# Patient Record
Sex: Female | Born: 2000
Health system: Southern US, Community
[De-identification: ages and names within clinical notes are randomized; demographics above are authoritative.]

## PROBLEM LIST (undated history)

## (undated) DIAGNOSIS — D649 Anemia, unspecified: Secondary | ICD-10-CM

## (undated) DIAGNOSIS — F419 Anxiety disorder, unspecified: Secondary | ICD-10-CM

## (undated) DIAGNOSIS — E611 Iron deficiency: Secondary | ICD-10-CM

## (undated) HISTORY — DX: Iron deficiency: E61.1

## (undated) HISTORY — DX: Anxiety disorder, unspecified: F41.9

## (undated) HISTORY — PX: WISDOM TOOTH EXTRACTION: SHX21

---
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
## 2019-01-14 ENCOUNTER — Ambulatory Visit (INDEPENDENT_AMBULATORY_CARE_PROVIDER_SITE_OTHER): Payer: Medicaid Other | Admitting: Family Medicine

## 2019-01-14 ENCOUNTER — Other Ambulatory Visit: Payer: Self-pay

## 2019-01-14 ENCOUNTER — Encounter: Payer: Self-pay | Admitting: Family Medicine

## 2019-01-14 VITALS — BP 118/78 | HR 92 | Temp 98.8°F | Ht 60.0 in | Wt 116.0 lb

## 2019-01-14 DIAGNOSIS — F320 Major depressive disorder, single episode, mild: Secondary | ICD-10-CM

## 2019-01-14 DIAGNOSIS — G44219 Episodic tension-type headache, not intractable: Secondary | ICD-10-CM | POA: Diagnosis not present

## 2019-01-14 DIAGNOSIS — F411 Generalized anxiety disorder: Secondary | ICD-10-CM | POA: Diagnosis not present

## 2019-01-14 DIAGNOSIS — Z23 Encounter for immunization: Secondary | ICD-10-CM | POA: Diagnosis not present

## 2019-01-14 DIAGNOSIS — Z7689 Persons encountering health services in other specified circumstances: Secondary | ICD-10-CM | POA: Diagnosis not present

## 2019-01-14 DIAGNOSIS — D509 Iron deficiency anemia, unspecified: Secondary | ICD-10-CM

## 2019-01-14 DIAGNOSIS — F32A Depression, unspecified: Secondary | ICD-10-CM

## 2019-01-14 NOTE — Patient Instructions (Signed)
Dear Vickie Bell,   It was nice to meet you today! I am glad you came in for your concerns. This document serves as a "wrap-up" to all that we discussed today and is listed as follows:    I will call you with your lab results and we will treat if necessary. In the mean time, you can come back if you feel like your headaches  Are getting worse, if you're feeling more anxious, or if you need anything else.    Thank you for choosing [facility] for your primary care needs and stay well!   Best,   Dr. Genia Hotter Resident Physician, PGY-1 [facility] [phone]    Don't forget to sign up for MyChart for instant access to your health profile, labs, orders, upcoming appointments or to contact your provider with questions. Stop at the front desk on the way out for more information about how to sign up!

## 2019-01-14 NOTE — Progress Notes (Signed)
SUBJECTIVE:  Subjective  PCP: Melene Plan, MD Patient ID: MRN [id number]  Date of birth: 01-Sep-2001  CC:  Chief Complaint  Patient presents with  . Establish Care   HPI Vickie Bell presents to establish care. Health complaints today include: anxiety and anemia.   Anxiety  Presents for initial visit. Onset was at an unknown time. The problem has been waxing and waning. Symptoms include depressed mood, excessive worry, hyperventilation, nervous/anxious behavior, palpitations and restlessness. Patient reports no chest pain, dizziness, obsessions or panic. Symptoms occur occasionally. The severity of symptoms is mild. The symptoms are aggravated by family issues. The quality of sleep is good. Nighttime awakenings: none.  Patient reports generalized anxiety. Patient reports that she often finds herself asking a lot of what if questions.  Her past medical history is significant for anemia. Past treatments include nothing.  GAD 7 : Generalized Anxiety Score 01/17/2019  Nervous, Anxious, on Edge 3  Control/stop worrying 3  Worry too much - different things 3  Trouble relaxing 0  Restless 0  Easily annoyed or irritable 0  Afraid - awful might happen 3  Total GAD 7 Score 12  Anxiety Difficulty Somewhat difficult     Office Visit from 01/14/2019 in [facility]  PHQ-9 Total Score  6     Anemia  Previously known to be anemic. Denies dizziness, feeling tired.  Does report fast heart beating.   Headaches  All last week, occurred twice. Headband distribution. Relieved with ibuprofen. Occurs midday, pain 7-8/10. No n/v. No photophobia.  Goes to the eye doctor q6 months. August, saw Dr. Karleen Hampshire (neurologist).   Review of Symptoms:  Review of Systems  Constitutional: Negative.  Negative for weight loss.  HENT: Negative.   Eyes: Negative.   Respiratory: Negative.   Cardiovascular: Positive for palpitations.  Gastrointestinal: Negative.   Genitourinary:  Negative.   Musculoskeletal: Positive for myalgias.  Skin: Negative.  Negative for rash.  Neurological: Positive for headaches. Negative for dizziness.  Endo/Heme/Allergies: Does not bruise/bleed easily.  Psychiatric/Behavioral: The patient is nervous/anxious.    Medications/Allergies:  No allergies  No medications   Past Medical History:  Past Medical History:  Diagnosis Date  . Anemia   . Anxiety   . Low iron    Previous Doctor:  Previously seen at [facility]   Surgeries:  Past Surgical History:  Procedure Laterality Date  . WISDOM TOOTH EXTRACTION     Hospitalizations:  None   Psychiatric History:  Hx of generalized anxiety. Never on medications, never hospitalized   Family History:  She indicated that her mother is alive. She indicated that her father is alive. She indicated that her sister is alive. She indicated that both of her brothers are alive. She indicated that her maternal grandmother is alive. She indicated that her maternal grandfather is alive. She indicated that her paternal grandmother is alive. She indicated that her paternal grandfather is alive. Negative for heart disease or sudden cardiac death.   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 Currently having trouble with math.    Tobacco/Vaping: denies   Alcohol: denies   Drugs: denies   Illicit Drugs:denies       Sexual history: never sexually active.    Partner preference: males. Has been in romantic relationships previously,  but not now.          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   She lives with her mom and her twin brother.    She has an older brother and older sister.   She enjoys dancing and playing basketball       OBJECTIVE:  Objective   BP 118/78   Pulse 92   Temp 98.8  F (37.1 C) (Oral)   Ht 5' (1.524 m)   Wt 116 lb (52.6 kg)   LMP 01/07/2019 (Approximate)   HC 3" (7.6 cm)   SpO2 99%   BMI 22.65 kg/m   Physical Exam Gen: NAD, alert, non-toxic, well-nourished, well-appearing, pleasant HEENT: Normocephaic, atraumatic. PERRLA, clear conjuctiva, no scleral icterus and injection. Normal EOM.  Hearing intact. TM pearly grey bilaterally with no fluid.  Neck supple with no LAD, nodules, or gross abnormality.  Nares patent with no discharge.  Maxillary and frontal sinuses nontender to palpation.  Oropharynx without erythema and lesions.  Tonsils nonswollen and without exudate.   CV: Regular rate and rhythm.  Normal S1-S2.  No murmur, gallops, S3, S4 appreciated.  Normal capillary refill bilaterally.  Radial pulses 2+ bilaterally. No bilateral lower extremity edema. Resp: Clear to auscultation bilaterally.  No wheezing, rales, rhonchi, or other abnormal lung sounds.  No increased work of breathing appreciated. Abd: Nontender and nondistended on palpation to all 4 quadrants.  Positive bowel sounds. Skin: No obvious rashes, lesions, or trauma.  Normal turgor.  MSK: Normal ROM. Normal strength and tone.  Neuro: Cranial nerves II through VI grossly intact. Gait normal.  Alert and oriented x4.  No obvious abnormal movements. Psych: Cooperative with exam.  Normal speech. Pleasant. Makes good eye contact. Genitourinary: deferred.   Pertinent Labs & Imaging:  Medications Reviewed in Chart Allergies Reviewed in Chart Past Medical History Reviewed in Chart.  Family History Reviewed in Chart.  Social History Reviewed in Chart  Pertinent Labs Reviewed in Chart Pertinent Imaging Reviewed Updates and changes made      ASSESSMENT & PLAN  Anemia, iron deficiency Hx of iron deficiency anemia  Need to get CBC for baseline Hgb + anemia panel   Anxiety state Mild. Pt with good coping skills. Continue to monitor. No medications.   Mild depression (HCC) Mild. Pt with good  coping skills. Continue to monitor. No medications.   Episodic tension-type headache, not intractable Possibly due to stress. No other neurological deficit. Ophthalmologist appointment q 6 months. No red flags.  Continue to monitor.     Health Maintenance Due  Topic Date Due  . HIV Screening  10/13/2015    Genia Hotter, M.D. [facility] 01/18/2019, 12:00 AM

## 2019-01-17 ENCOUNTER — Encounter: Payer: Self-pay | Admitting: Family Medicine

## 2019-01-17 DIAGNOSIS — F32A Depression, unspecified: Secondary | ICD-10-CM | POA: Insufficient documentation

## 2019-01-17 DIAGNOSIS — D509 Iron deficiency anemia, unspecified: Secondary | ICD-10-CM | POA: Insufficient documentation

## 2019-01-17 DIAGNOSIS — F419 Anxiety disorder, unspecified: Secondary | ICD-10-CM | POA: Insufficient documentation

## 2019-01-17 DIAGNOSIS — F320 Major depressive disorder, single episode, mild: Secondary | ICD-10-CM

## 2019-01-17 DIAGNOSIS — F411 Generalized anxiety disorder: Secondary | ICD-10-CM | POA: Insufficient documentation

## 2019-01-17 DIAGNOSIS — Z23 Encounter for immunization: Secondary | ICD-10-CM | POA: Insufficient documentation

## 2019-01-17 HISTORY — DX: Depression, unspecified: F32.A

## 2019-01-17 HISTORY — DX: Major depressive disorder, single episode, mild: F32.0

## 2019-01-17 LAB — ANEMIA PANEL
FERRITIN: 2 ng/mL — AB (ref 15–77)
FOLATE, HEMOLYSATE: 285 ng/mL
FOLATE, RBC: 1239 ng/mL (ref >498)
HEMATOCRIT: 23 % — AB (ref 34.0–46.6)
IRON SATURATION: 3 % — AB (ref 15–55)
Iron: 13 ug/dL — ABNORMAL LOW (ref 27–159)
Retic Ct Pct: 0.9 % (ref 0.6–2.6)
Total Iron Binding Capacity: 472 ug/dL — ABNORMAL HIGH (ref 250–450)
UIBC: 459 ug/dL — ABNORMAL HIGH (ref 131–425)
VITAMIN B 12: 448 pg/mL (ref 232–1245)

## 2019-01-17 LAB — BASIC METABOLIC PANEL
BUN/Creatinine Ratio: 26 — ABNORMAL HIGH (ref 9–23)
BUN: 15 mg/dL (ref 6–20)
CO2: 23 mmol/L (ref 20–29)
CREATININE: 0.58 mg/dL (ref 0.57–1.00)
Calcium: 9.5 mg/dL (ref 8.7–10.2)
Chloride: 106 mmol/L (ref 96–106)
GFR, EST AFRICAN AMERICAN: 156 mL/min/{1.73_m2} (ref >59)
GFR, EST NON AFRICAN AMERICAN: 135 mL/min/{1.73_m2} (ref >59)
Glucose: 104 mg/dL — ABNORMAL HIGH (ref 65–99)
POTASSIUM: 4.1 mmol/L (ref 3.5–5.2)
SODIUM: 141 mmol/L (ref 134–144)

## 2019-01-17 LAB — TSH: TSH: 0.718 u[IU]/mL (ref 0.450–4.500)

## 2019-01-17 NOTE — Assessment & Plan Note (Signed)
Mild. Pt with good coping skills. Continue to monitor. No medications.  

## 2019-01-17 NOTE — Assessment & Plan Note (Signed)
Mild. Pt with good coping skills. Continue to monitor. No medications.

## 2019-01-17 NOTE — Progress Notes (Deleted)
   New Patient Office Visit  Subjective:  Patient ID: Vickie Bell, female    DOB: 10-23-2001  Age: 18 y.o. MRN: [id number]  CC:  Chief Complaint  Patient presents with  . Establish Care   HPI Vickie Bell presents to establish care. Health complaints today include: anxiety and anemia.   #Anxiety  Patient reports generalized anxiety  Patient reports that she often finds herself asking a lot of what if questions.   Past Medical History:  Diagnosis Date  . Anemia   . Low iron     Past Surgical History:  Procedure Laterality Date  . WISDOM TOOTH EXTRACTION      Family History  Problem Relation Age of Onset  . Mood Disorder Mother   . Hypertension Mother   . Asthma Mother   . 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   She lives with her mom and her twin brother.    She has an older brother and older sister.   She enjoys dancing and playing basketball     ROS Review of Systems  Objective:   Today's Vitals: BP 118/78   Pulse 92   Temp 98.8 F (37.1 C) (Oral)   Ht 5' (1.524 m)   Wt 116 lb (52.6 kg)   LMP 01/07/2019 (Approximate)   HC 3" (7.6 cm)   SpO2 99%   BMI 22.65 kg/m   Physical Exam  Assessment & Plan:   Problem List Items Addressed This Visit    None    Visit Diagnoses    Other iron deficiency anemia    -  Primary   Relevant Orders   Anemia panel (Completed)   TSH (Completed)   Basic Metabolic Panel (Completed)   CBC   Anxiety state       Need for immunization against influenza       Relevant Orders   Flu Vaccine QUAD 36+ mos IM (Completed)      Follow-up: No follow-ups on file.   Melene Plan, MD

## 2019-01-17 NOTE — Assessment & Plan Note (Addendum)
Hx of iron deficiency anemia  Need to get CBC for baseline Hgb + anemia panel

## 2019-01-17 NOTE — Assessment & Plan Note (Signed)
Possibly due to stress. No other neurological deficit. Ophthalmologist appointment q 6 months. No red flags.  Continue to monitor.

## 2019-01-18 ENCOUNTER — Encounter: Payer: Self-pay | Admitting: Family Medicine

## 2019-01-18 ENCOUNTER — Telehealth: Payer: Self-pay | Admitting: *Deleted

## 2019-01-18 NOTE — Telephone Encounter (Signed)
Contacted pt mom and appointment scheduled for 01/28/19. Lamonte Sakai, April D, [place]

## 2019-01-18 NOTE — Telephone Encounter (Signed)
-----   Message from Melene Plan, MD sent at 01/18/2019 12:11 AM EST ----- Did not obtain CBC for baseline Hgb, this was my mistake. Can you please ask this patient to come in at her earliest convenience to do another blood draw? Please let patient know that her TSH was normal, but her iron is low. I will wait until I get her CBC back in order to get a better understand for the cause of her anemia.

## 2019-01-21 ENCOUNTER — Other Ambulatory Visit: Payer: Medicaid Other

## 2019-01-21 DIAGNOSIS — D509 Iron deficiency anemia, unspecified: Secondary | ICD-10-CM

## 2019-01-22 LAB — CBC
HEMOGLOBIN: 6.6 g/dL — AB (ref 11.1–15.9)
Hematocrit: 23.1 % — ABNORMAL LOW (ref 34.0–46.6)
MCH: 19.2 pg — ABNORMAL LOW (ref 26.6–33.0)
MCHC: 28.6 g/dL — ABNORMAL LOW (ref 31.5–35.7)
MCV: 67 fL — ABNORMAL LOW (ref 79–97)
Platelets: 271 10*3/uL (ref 150–450)
RBC: 3.43 x10E6/uL — ABNORMAL LOW (ref 3.77–5.28)
RDW: 16.3 % — ABNORMAL HIGH (ref 11.7–15.4)
WBC: 4.2 10*3/uL (ref 3.4–10.8)

## 2019-01-22 NOTE — Addendum Note (Signed)
Addended by: Melene Plan on: 01/22/2019 02:35 PM   Modules accepted: Orders

## 2019-01-24 MED ORDER — FERROUS SULFATE 325 (65 FE) MG PO TBEC: 650.0000 mg | DELAYED_RELEASE_TABLET | ORAL | 3 refills | Status: DC

## 2019-01-24 NOTE — Progress Notes (Signed)
Patient with history of iron deficiency anemia, however no records from [facility] available at initial visit.  Patient's labs are most consistent with iron deficiency anemia.  Hemoglobin of 6.6 obtained on 2/21.  Attempted to reach family but left voice message. We will plan to start iron supplements every other day, twice daily between meals.  Plans to titrate with any intolerance.  Plans to recheck CBC in 3 weeks for response to iron pills.

## 2019-01-24 NOTE — Addendum Note (Signed)
Addended by: Melene Plan on: 01/24/2019 09:58 AM   Modules accepted: Orders

## 2019-01-25 ENCOUNTER — Telehealth: Payer: Self-pay

## 2019-01-25 NOTE — Telephone Encounter (Signed)
Patient with history of iron deficiency anemia, however no records from [facility] available at initial visit.  Patient's labs are most consistent with iron deficiency anemia.  Hemoglobin of 6.6 obtained on 2/21.  Attempted to reach family but left voice message. We will plan to start iron supplements every other day, twice daily between meals.  Plans to titrate with any intolerance.  Plans to recheck CBC in 3 weeks for response to iron pills.   Pts mom called nurse line returning pcp phone call. I relayed the lab results and plan to mom. I informed mom an iron supplement has been called into their pharmacy, directions of use given. Informed mom to return to clinic for a lab visit to recheck CBC after 3 weeks of taking the iron supplement. Mom verbalized understanding.

## 2019-01-28 ENCOUNTER — Other Ambulatory Visit: Payer: Medicaid Other

## 2019-09-15 IMAGING — DX DG SHOULDER 2+V*L*
4 series · 4 of 4 positions shown · non-contrast
Comparison: None.

CLINICAL DATA: Pain following fall

EXAM:
LEFT SHOULDER - 2+ VIEW

[shoulder ap]
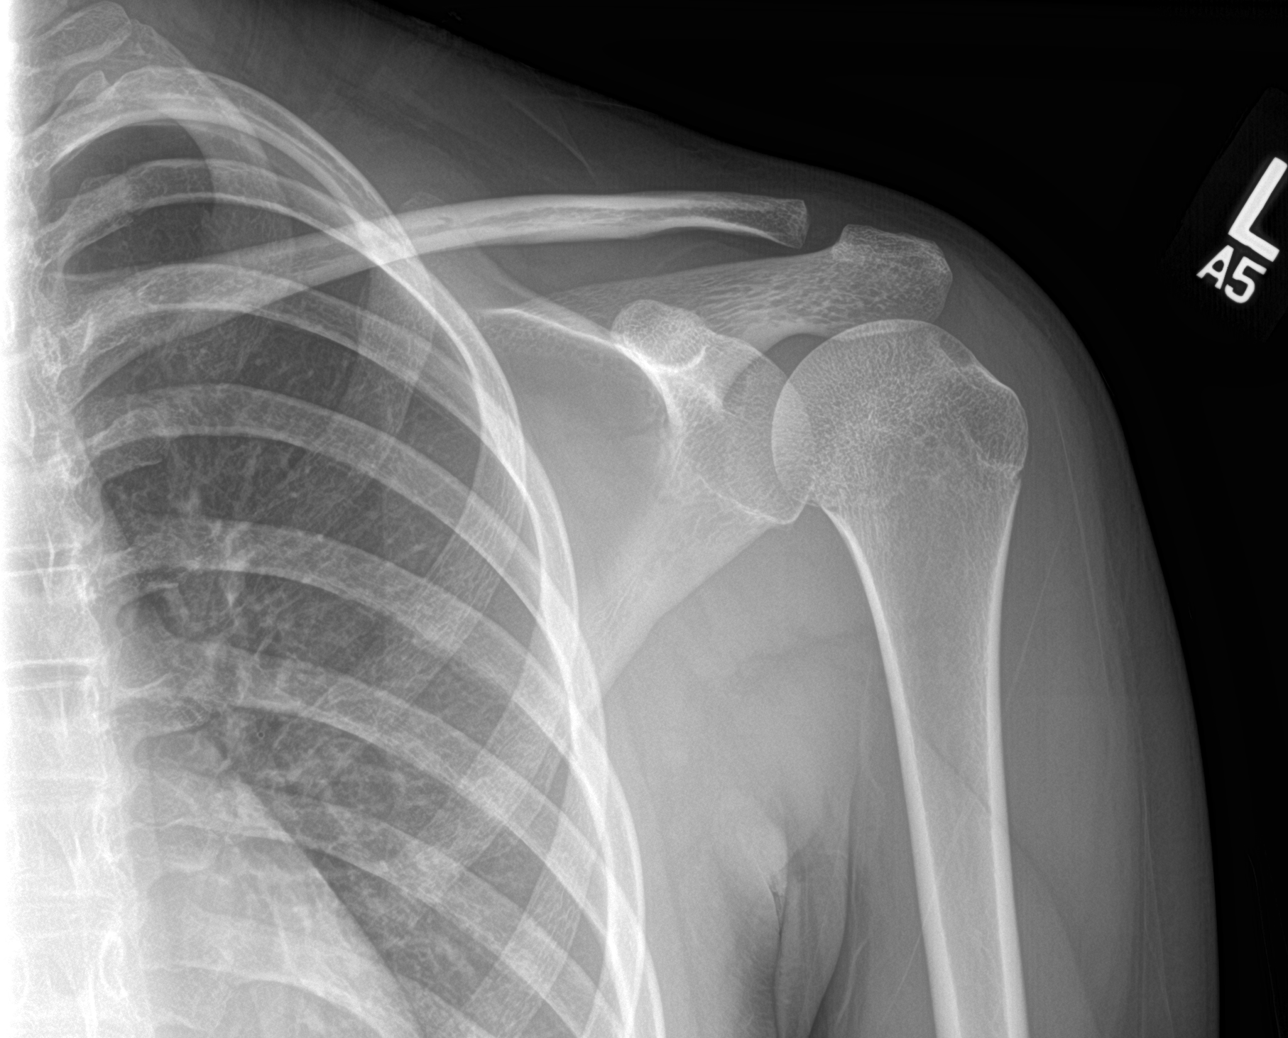

[shoulder grashey]
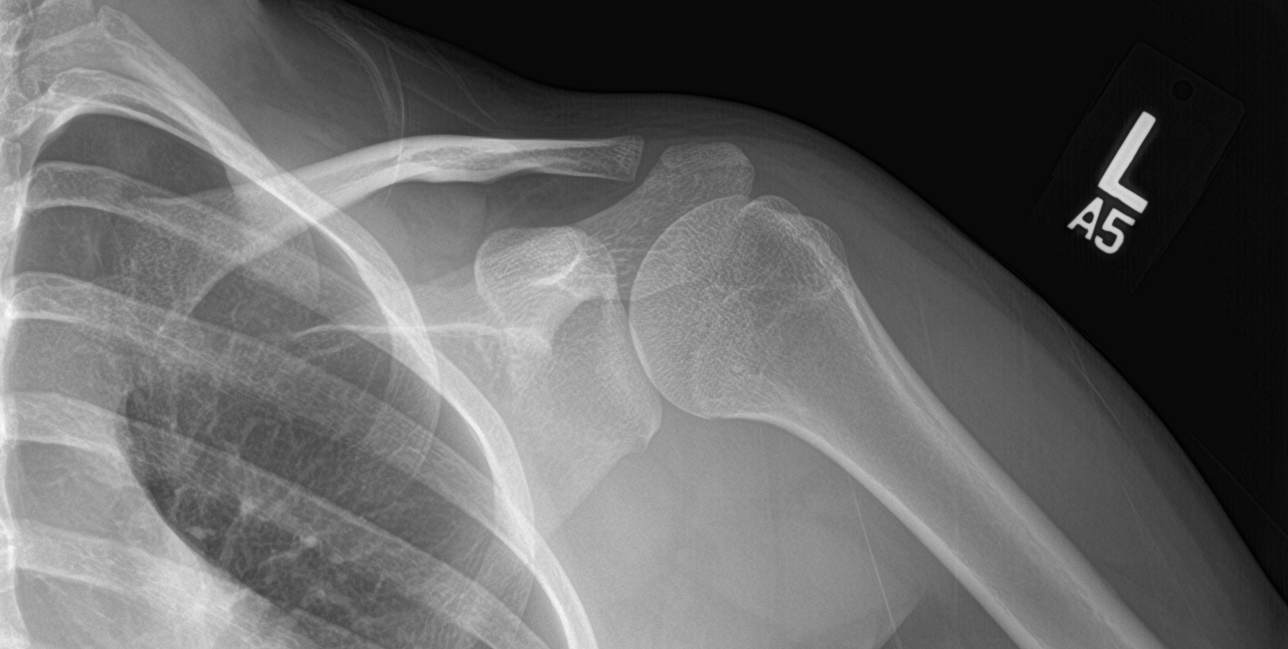

[shoulder y-view]
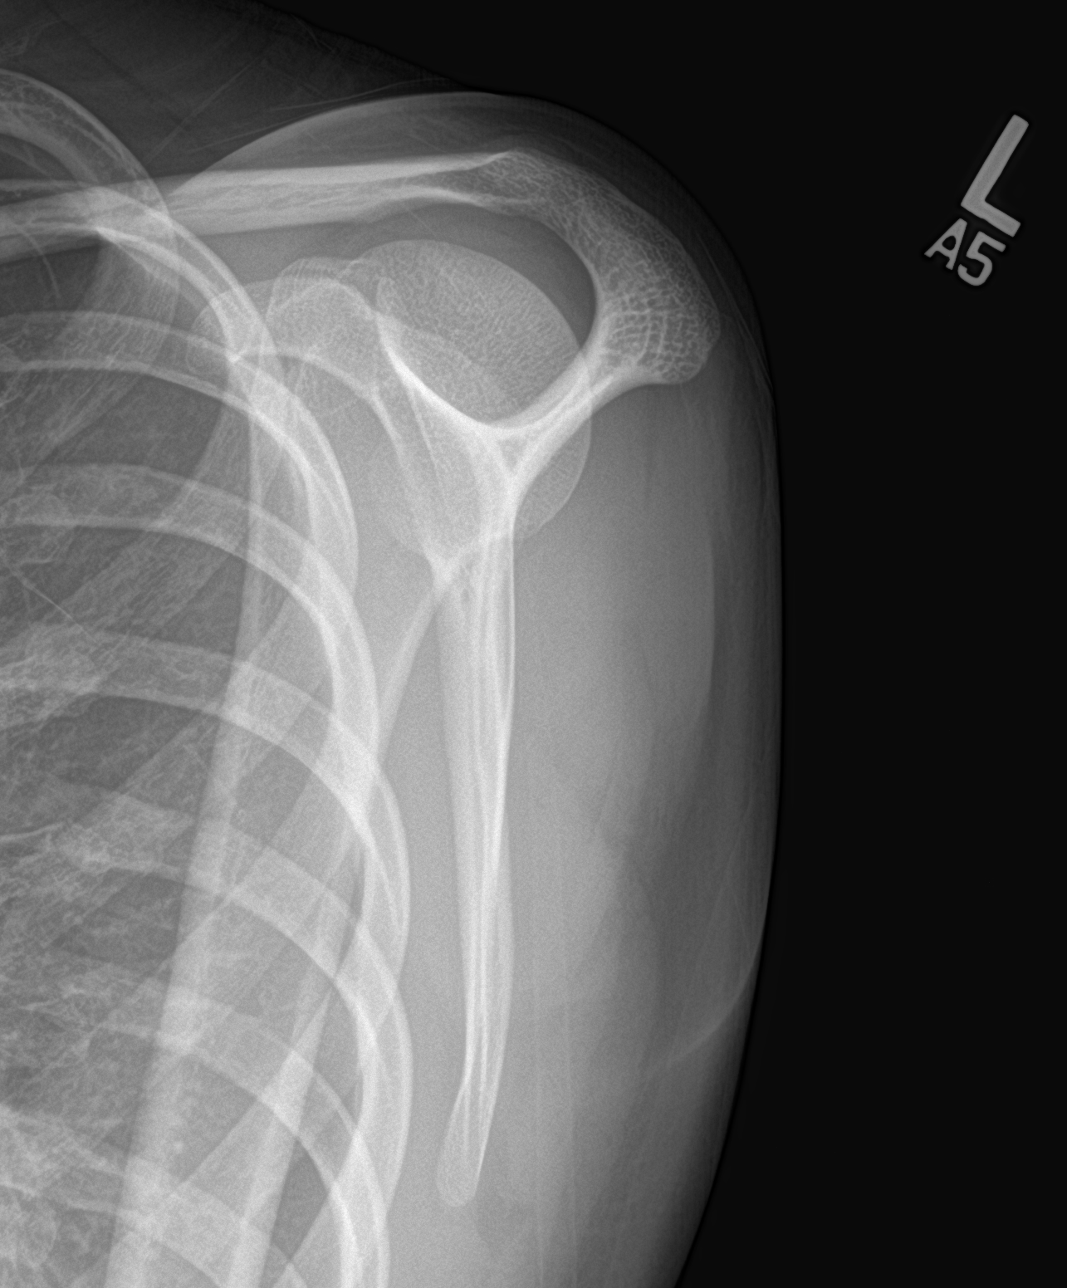

[shoulder axial]
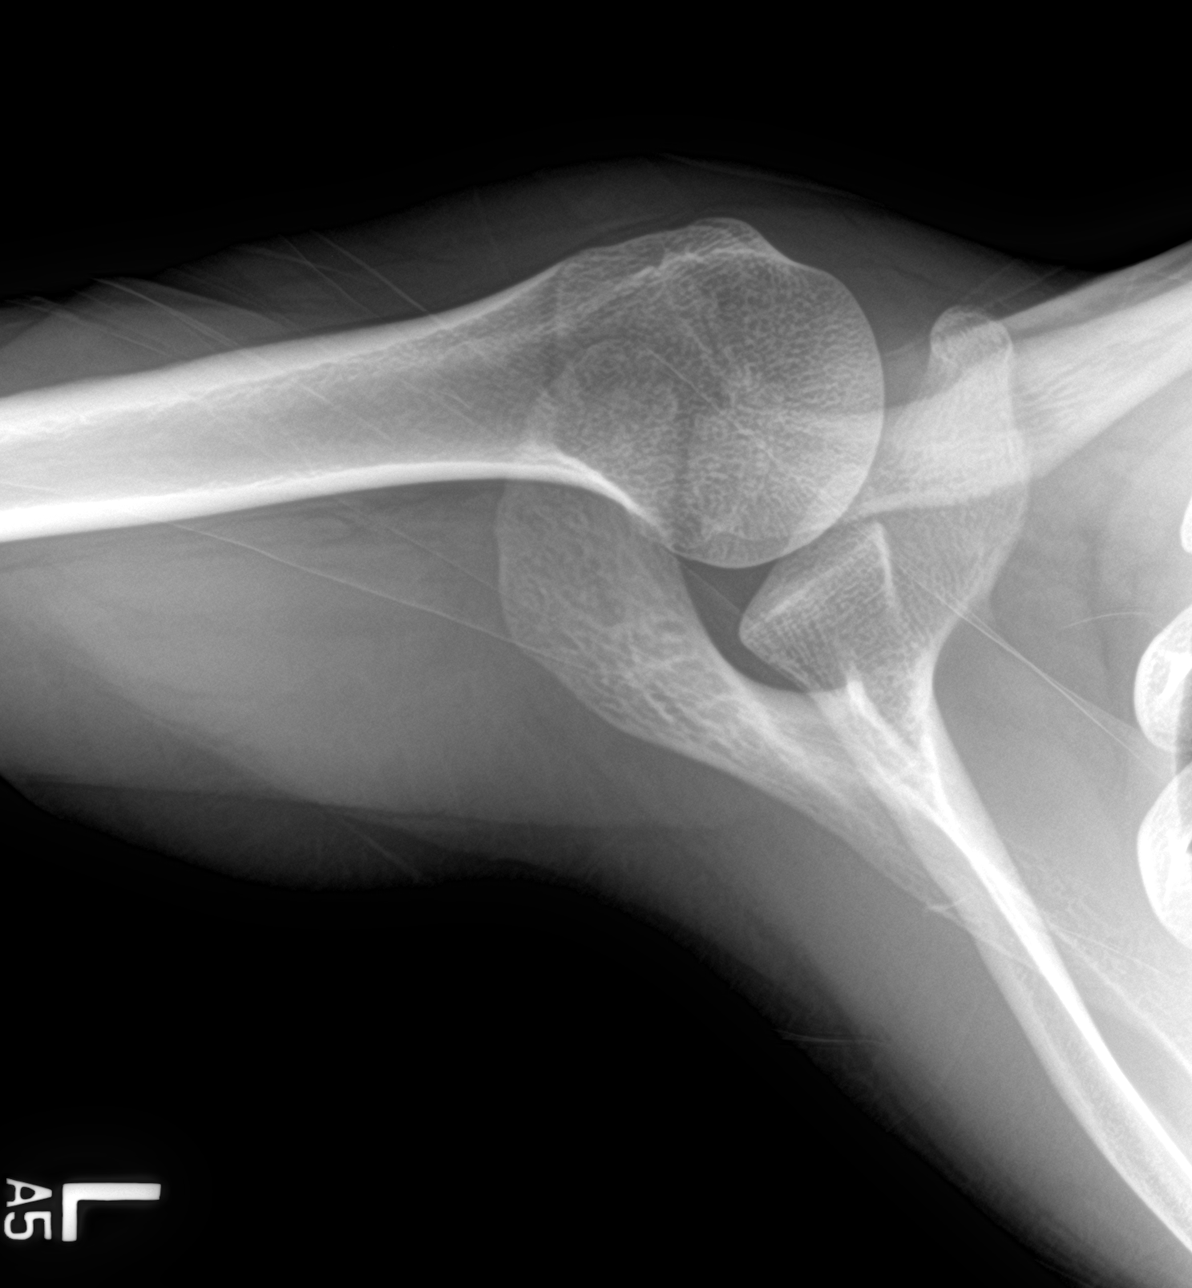

[4 of 4 positions shown; findings below may reference images not displayed]

FINDINGS: Internal rotation, external rotation, Y scapular, axillary images
were obtained. There is no evident fracture or dislocation. The
joint spaces appear normal. No erosive change. Visualized left lung
clear.
IMPRESSION: No fracture or dislocation.  No evident arthropathy.

## 2020-01-06 ENCOUNTER — Other Ambulatory Visit: Payer: Self-pay

## 2020-01-06 ENCOUNTER — Ambulatory Visit (INDEPENDENT_AMBULATORY_CARE_PROVIDER_SITE_OTHER): Payer: Medicaid Other

## 2020-01-06 DIAGNOSIS — Z111 Encounter for screening for respiratory tuberculosis: Secondary | ICD-10-CM | POA: Diagnosis not present

## 2020-01-06 NOTE — Progress Notes (Signed)
Patient is here for a PPD test. Pt has no known allergies and has never had positive PPD test.   Placed in left forearm at 1500. Site unremarkable. Wheel noted after placement.   Pt scheduled nurse visit for Monday 01/09/20 at 1500.  Veronda Prude, RN

## 2020-01-09 ENCOUNTER — Ambulatory Visit (INDEPENDENT_AMBULATORY_CARE_PROVIDER_SITE_OTHER): Payer: Medicaid Other | Admitting: *Deleted

## 2020-01-09 ENCOUNTER — Other Ambulatory Visit: Payer: Self-pay

## 2020-01-09 DIAGNOSIS — Z111 Encounter for screening for respiratory tuberculosis: Secondary | ICD-10-CM

## 2020-01-09 LAB — TB SKIN TEST
Induration: 0 mm
TB Skin Test: NEGATIVE

## 2020-01-09 NOTE — Progress Notes (Signed)
Patient is here for a PPD read.  It was placed on 01/06/20 in the left forearm @ 3pm.    PPD RESULTS:  Result: Negative Induration: 0 mm  Letter created and given to patient for documentation purposes. Jone Baseman, CMA

## 2020-01-17 ENCOUNTER — Ambulatory Visit (INDEPENDENT_AMBULATORY_CARE_PROVIDER_SITE_OTHER): Payer: Medicaid Other | Admitting: Family Medicine

## 2020-01-17 ENCOUNTER — Other Ambulatory Visit: Payer: Self-pay

## 2020-01-17 ENCOUNTER — Encounter: Payer: Self-pay | Admitting: Family Medicine

## 2020-01-17 VITALS — BP 110/76 | HR 74 | Wt 122.8 lb

## 2020-01-17 DIAGNOSIS — F419 Anxiety disorder, unspecified: Secondary | ICD-10-CM

## 2020-01-17 DIAGNOSIS — F320 Major depressive disorder, single episode, mild: Secondary | ICD-10-CM

## 2020-01-17 DIAGNOSIS — F32A Depression, unspecified: Secondary | ICD-10-CM

## 2020-01-17 MED ORDER — FLUOXETINE HCL 10 MG PO CAPS: 10.0000 mg | ORAL_CAPSULE | Freq: Every day | ORAL | 0 refills | Status: DC

## 2020-01-17 MED FILL — FLUoxetine HCL 10 MG CAPS: 10 | 30 days supply | Qty: 30 | Fill #0

## 2020-01-17 NOTE — Assessment & Plan Note (Addendum)
Patient reports increased anxiety since starting her new job and moving in with her grandmother.  She reports that she listens to music to decrease her anxiety which helps some but says that she is not exercising regularly and feels like that may help as well.  Patient is also interested in talking with a therapist regarding her issues with anxiety and depression.  Patient reports heart palpitations as well as muscle twitches in her right arm.  I believe these are most likely related to the anxiety. -Follow-up with behavioral health -Started patient on fluoxetine 10 mg daily -Encouraged exercise, listening to music, and other coping mechanisms to decrease anxiety -Follow-up in 2-4 weeks -Discussed return precautions if muscle spasms get worse or patient begins to feel worse.  Patient voices understanding

## 2020-01-17 NOTE — Assessment & Plan Note (Signed)
Patient reports mild depression.  PHQ 2 score today was 5, PHQ-9 score was 11.  Patient reports she feels this is most likely related to the fact that she does not get to spend time with friends like she did before COVID.  She feels like there is not much to do outside of work.  She enjoys listening to music to help with her depression anxiety. -Follow-up in 2 to 4 weeks, repeat PHQ-9 -Started on Prozac 10 mg daily -Recommend seeing behavioral health

## 2020-01-17 NOTE — Progress Notes (Signed)
   CHIEF COMPLAINT / HPI:  Right arm twitching Patient reports that she has had twitching in her right arm near her elbow for the last 2 weeks.  She denies any injury to that arm.  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  She reports a large amount of increased stress at this time.  She has also noticed heart palpitations and feels she has difficulty catching her breath.  She reports sleeping on it may make the arm worse but feels like the twitching overall has gotten better over the last 2 weeks but is still present.  Patient denies any weakness in that arm.  Depression/anxiety: Patient reports increased anxiety since starting her new job 2 weeks ago.  She reports that she enjoys the work but that her teacher stresses her out.  To deal with the stress she either does not listen to her teacher or she listens to music including [place].  She reports that she feels like exercise may help with anxiety and depression and reports that she will start doing that.  She denies any SI or HI.  PERTINENT  PMH / PSH: Anxiety and mild depression   OBJECTIVE: BP 110/76   Pulse 74   Wt 122 lb 12.8 oz (55.7 kg)   LMP 12/30/2019   SpO2 99%   BMI 23.98 kg/m   General: NAD, resting comfortably on exam table HEENT: Atraumatic. Normocephalic.  Neck: No cervical lymphadenopathy.  Cardiac: RRR, no m/r/g Respiratory: CTAB, normal work of breathing Neuro: alert and oriented, patient has muscle spasms in right arm.  They resolve or become irregular when patient is distracted Psych: Patient reports increased levels of anxiety and mild depression although she reports she is doing "okay with depression".  Patient recently started new job which is why she feels anxious as well as moved in with her grandmother because her mother moved.  Patient denies current or past HI/SI.  ASSESSMENT / PLAN:  Anxiety Patient reports increased anxiety since starting her  new job and moving in with her grandmother.  She reports that she listens to music to decrease her anxiety which helps some but says that she is not exercising regularly and feels like that may help as well.  Patient is also interested in talking with a therapist regarding her issues with anxiety and depression.  Patient reports heart palpitations as well as muscle twitches in her right arm.  I believe these are most likely related to the anxiety. -Follow-up with behavioral health -Started patient on fluoxetine 10 mg daily -Encouraged exercise, listening to music, and other coping mechanisms to decrease anxiety -Follow-up in 2-4 weeks -Discussed return precautions if muscle spasms get worse or patient begins to feel worse.  Patient voices understanding    Mild depression (HCC) Patient reports mild depression.  PHQ 2 score today was 5, PHQ-9 score was 11.  Patient reports she feels this is most likely related to the fact that she does not get to spend time with friends like she did before COVID.  She feels like there is not much to do outside of work.  She enjoys listening to music to help with her depression anxiety. -Follow-up in 2 to 4 weeks, repeat PHQ-9 -Started on Prozac 10 mg daily -Recommend seeing behavioral health     Derrel Nip, MD [facility]

## 2020-01-17 NOTE — Patient Instructions (Signed)
It was a pleasure to meet you today.  I am sorry you are having issues with this twitching in your arm as well as issues with anxiety.  I am going to do is prescribe you medication to help with this anxiety.  Would also like you to talk with one of our therapist that works at our clinic.  She will be in contact with you in the near future and will schedule an appointment.  If you have any questions or concerns please reach out to the clinic we will be happy to help you in any way we can.   Generalized Anxiety Disorder, Adult Generalized anxiety disorder (GAD) is a mental health disorder. People with this condition constantly worry about everyday events. Unlike normal anxiety, worry related to GAD is not triggered by a specific event. These worries also do not fade or get better with time. GAD interferes with life functions, including relationships, work, and school. GAD can vary from mild to severe. People with severe GAD can have intense waves of anxiety with physical symptoms (panic attacks). What are the causes? The exact cause of GAD is not known. What increases the risk? This condition is more likely to develop in:  Women.  People who have a family history of anxiety disorders.  People who are very shy.  People who experience very stressful life events, such as the death of a loved one.  People who have a very stressful family environment. What are the signs or symptoms? People with GAD often worry excessively about many things in their lives, such as their health and family. They may also be overly concerned about:  Doing well at work.  Being on time.  Natural disasters.  Friendships. Physical symptoms of GAD include:  Fatigue.  Muscle tension or having muscle twitches.  Trembling or feeling shaky.  Being easily startled.  Feeling like your heart is pounding or racing.  Feeling out of breath or like you cannot take a deep breath.  Having trouble falling asleep or  staying asleep.  Sweating.  Nausea, diarrhea, or irritable bowel syndrome (IBS).  Headaches.  Trouble concentrating or remembering facts.  Restlessness.  Irritability. How is this diagnosed? Your health care provider can diagnose GAD based on your symptoms and medical history. You will also have a physical exam. The health care provider will ask specific questions about your symptoms, including how severe they are, when they started, and if they come and go. Your health care provider may ask you about your use of alcohol or drugs, including prescription medicines. Your health care provider may refer you to a mental health specialist for further evaluation. Your health care provider will do a thorough examination and may perform additional tests to rule out other possible causes of your symptoms. To be diagnosed with GAD, a person must have anxiety that:  Is out of his or her control.  Affects several different aspects of his or her life, such as work and relationships.  Causes distress that makes him or her unable to take part in normal activities.  Includes at least three physical symptoms of GAD, such as restlessness, fatigue, trouble concentrating, irritability, muscle tension, or sleep problems. Before your health care provider can confirm a diagnosis of GAD, these symptoms must be present more days than they are not, and they must last for six months or longer. How is this treated? The following therapies are usually used to treat GAD:  Medicine. Antidepressant medicine is usually prescribed for long-term  daily control. Antianxiety medicines may be added in severe cases, especially when panic attacks occur.  Talk therapy (psychotherapy). Certain types of talk therapy can be helpful in treating GAD by providing support, education, and guidance. Options include: ? Cognitive behavioral therapy (CBT). People learn coping skills and techniques to ease their anxiety. They learn to  identify unrealistic or negative thoughts and behaviors and to replace them with positive ones. ? Acceptance and commitment therapy (ACT). This treatment teaches people how to be mindful as a way to cope with unwanted thoughts and feelings. ? Biofeedback. This process trains you to manage your body's response (physiological response) through breathing techniques and relaxation methods. You will work with a therapist while machines are used to monitor your physical symptoms.  Stress management techniques. These include yoga, meditation, and exercise. A mental health specialist can help determine which treatment is best for you. Some people see improvement with one type of therapy. However, other people require a combination of therapies. Follow these instructions at home:  Take over-the-counter and prescription medicines only as told by your health care provider.  Try to maintain a normal routine.  Try to anticipate stressful situations and allow extra time to manage them.  Practice any stress management or self-calming techniques as taught by your health care provider.  Do not punish yourself for setbacks or for not making progress.  Try to recognize your accomplishments, even if they are small.  Keep all follow-up visits as told by your health care provider. This is important. Contact a health care provider if:  Your symptoms do not get better.  Your symptoms get worse.  You have signs of depression, such as: ? A persistently sad, cranky, or irritable mood. ? Loss of enjoyment in activities that used to bring you joy. ? Change in weight or eating. ? Changes in sleeping habits. ? Avoiding friends or family members. ? Loss of energy for normal tasks. ? Feelings of guilt or worthlessness. Get help right away if:  You have serious thoughts about hurting yourself or others. If you ever feel like you may hurt yourself or others, or have thoughts about taking your own life, get help  right away. You can go to your nearest emergency department or call:  Your local emergency services (911 in the [place]  A suicide crisis helpline, such as the [facility] at 646-615-7591. This is open 24 hours a day. Summary  Generalized anxiety disorder (GAD) is a mental health disorder that involves worry that is not triggered by a specific event.  People with GAD often worry excessively about many things in their lives, such as their health and family.  GAD may cause physical symptoms such as restlessness, trouble concentrating, sleep problems, frequent sweating, nausea, diarrhea, headaches, and trembling or muscle twitching.  A mental health specialist can help determine which treatment is best for you. Some people see improvement with one type of therapy. However, other people require a combination of therapies. This information is not intended to replace advice given to you by your health care provider. Make sure you discuss any questions you have with your health care provider. Document Revised: 10/30/2017 Document Reviewed: 10/07/2016 Elsevier Patient Education  2020 [employer].

## 2020-01-18 ENCOUNTER — Telehealth: Payer: Self-pay | Admitting: Psychology

## 2020-01-18 NOTE — Telephone Encounter (Signed)
Called pt to schedule BH appt for 2/25 at 10AM virtual

## 2020-01-18 NOTE — Telephone Encounter (Signed)
-----  Message from Gifford Shave, MD sent at 01/17/2020  5:13 PM EST ----- Hi Dr. Minette Headland just met with this patient in the clinic.  She came in complaining of muscle spasms but scored a 5 on her PHQ 2 so we gave her a GAD-7 as well as a PHQ-9.  Her GAD-7 score was 17 and her PHQ-9 score was 11.  She just started a new job and recently moved in with her grandmother from living with her mom.  She reports increased stress and a lot of anxiety with heart palpitations.  The twitching in her arm that she initially came in started 2 weeks ago the day after she started her new job.  She is very interested in speaking with someone about her anxiety.  I feel like she may be a good patient for you.

## 2020-01-18 NOTE — Telephone Encounter (Signed)
Thank you :)

## 2020-01-26 ENCOUNTER — Other Ambulatory Visit: Payer: Self-pay

## 2020-01-26 ENCOUNTER — Ambulatory Visit: Payer: Medicaid Other | Admitting: Psychology

## 2020-02-03 ENCOUNTER — Ambulatory Visit: Payer: Medicaid Other | Admitting: Psychology

## 2020-02-07 ENCOUNTER — Other Ambulatory Visit: Payer: Self-pay | Admitting: Family Medicine

## 2020-02-07 ENCOUNTER — Ambulatory Visit (INDEPENDENT_AMBULATORY_CARE_PROVIDER_SITE_OTHER): Payer: Medicaid Other | Admitting: Family Medicine

## 2020-02-07 ENCOUNTER — Encounter: Payer: Self-pay | Admitting: Family Medicine

## 2020-02-07 ENCOUNTER — Other Ambulatory Visit: Payer: Self-pay

## 2020-02-07 VITALS — BP 108/70 | HR 103 | Ht 60.0 in | Wt 121.0 lb

## 2020-02-07 DIAGNOSIS — Z23 Encounter for immunization: Secondary | ICD-10-CM

## 2020-02-07 DIAGNOSIS — F320 Major depressive disorder, single episode, mild: Secondary | ICD-10-CM

## 2020-02-07 DIAGNOSIS — Z0000 Encounter for general adult medical examination without abnormal findings: Secondary | ICD-10-CM | POA: Diagnosis not present

## 2020-02-07 DIAGNOSIS — F419 Anxiety disorder, unspecified: Secondary | ICD-10-CM

## 2020-02-07 DIAGNOSIS — F32A Depression, unspecified: Secondary | ICD-10-CM

## 2020-02-07 DIAGNOSIS — D509 Iron deficiency anemia, unspecified: Secondary | ICD-10-CM

## 2020-02-07 MED ORDER — FLUOXETINE HCL 20 MG PO CAPS: ORAL_CAPSULE | ORAL | 0 refills | Status: DC

## 2020-02-07 MED ORDER — FERROUS SULFATE 325 (65 FE) MG PO TBEC: 650.0000 mg | DELAYED_RELEASE_TABLET | ORAL | 3 refills | Status: DC

## 2020-02-07 MED FILL — FLUoxetine HCL 20 MG CAPS: 20 | 28 days supply | Qty: 49 | Fill #0

## 2020-02-07 NOTE — Progress Notes (Signed)
Established Patient - Annual Preventative Physical Subjective  Patient ID: MRN [id number]  Date of birth: 07/23/2001   PCP: Melene Plan, MD  CC: Annual Preventative Physical + Follow up Depression/Anxiety    Vickie Bell is a 19 y.o. female who presents today with the following problems:  Anxiety/mild depression Patient was started on fluoxetine 10 mg by Dr. Trenton Gammon on January 17, 2020.  Patient reports that she is tolerating the medications well and denies any side effects.  She had an appointment to meet with Dr. Shawnee Knapp twice but reports missing those appointments. Today her PHQ-9 and GAD-7 are 7 and 11 respectively.  Her PHQ-9 has improved from last visit.  To cope, she listens to music.  She does feel comfortable talking to her family about anxiety and depression.  Anemia  Patient reports that she has not been taking her iron at home. Patient reports racing heart beat with activity and dizziness with standing up. Denies any falls or syncope. Normal TSH last year.   HISTORY Medications, allergies, medical history, family history and social history were reviewed and edited as necessary. Pertinent findings included in HPI. Pertinent Changes in History:  Medical: Anxiety mild depression Surgical: No changes Family: No changes Social: Patient currently not sexually active and has never been sexually active [place] reports that she has never smoked. She has never used smokeless tobacco. She reports that she does not drink alcohol or use drugs. ROS: See HPI   Objective  Physical Exam:  BP 108/70   Pulse (!) 103   Ht 5' (1.524 m)   Wt 121 lb (54.9 kg)   LMP 01/28/2020 (Exact Date)   SpO2 100%   BMI 23.63 kg/m  Gen: NAD, alert, non-toxic, well-nourished, well-appearing, pleasant HEENT: Normocephaic, atraumatic. PERRLA, clear conjuctiva, no scleral icterus, pallor, injection. Normal EOM. Hearing intact. Neck supple with no LAD, nodules, or gross abnormality.  Nares patent  with no discharge.  Maxillary and frontal sinuses nontender to palpation.  Oropharynx without erythema and lesions.  Tonsils nonswollen and without exudate.   CV: Regular rate and rhythm.  Normal S1-S2.  1/6 systolic crescendo murmur. Normal capillary refill bilaterally.  Radial pulses 2+ bilaterally. No bilateral lower extremity edema. Resp: Clear to auscultation bilaterally.  No wheezing, rales, rhonchi, or other abnormal lung sounds.  No increased work of breathing appreciated. Abd: Nontender and nondistended on palpation to all 4 quadrants.  Positive bowel sounds. Skin: No obvious rashes, lesions, or trauma.  Normal turgor.  MSK: Normal ROM. Normal strength and tone.  Neuro: Cranial nerves II through VI grossly intact. Gait normal.  No obvious abnormal movements. Psych: Cooperative with exam.  Normal speech. Pleasant. Makes good eye contact. Genitourinary: deferred.   Pertinent Labs & Imaging:  Hemoglobin on 01/21/2019 was 6.6.  Last values in 2011 were normal. Assessment & Plan  Anemia, iron deficiency Patient's history significant for feeling tired with little energy, feeling that her heart is racing with activity.  On physical exam, she has 1 out of 6 systolic murmur but normal cap refill and does not appear pallorous.  Will obtain CBC today.  Patient agreeable to restart her iron today.  If hemoglobin is low again on recheck, will have patient come back in 2 to 3 days for repeat hematocrit to see how she is responding to oral iron.  Otherwise, can recheck hemoglobin levels in 2 to 4 weeks.   Anxiety Patient is doing well with mild improvement.  She is taking her 10 mg of fluoxetine.  She is agreeable to increase to 20 mg and then titrate to 40 mg next week.  Patient encouraged to follow-up with Dr. Hartford Poli, though she has missed appointments as it would be beneficial for her to learn tools to help with anxiety and depression.  We will follow-up with patient in 1 month for medication follow-up.   This can be done virtually if she does not require to be seen for anemia.  Healthcare maintenance Patient is not sexually active and never has been sexually active.  Have discontinued HIV screening at this time.  Patient counseled on return when she becomes sexually active for screening of STDs and HIV.  She is agreeable.  Patient also received Menactra today.   Wilber Oliphant, M.D.  PGY-2  Family Medicine  [phone] 02/07/2020 4:57 PM

## 2020-02-07 NOTE — Patient Instructions (Signed)
Dear [name],   It was good to see you! Thank you for taking your time to come in to be seen. Today, we discussed the following:   Annual Exam   Increase Fluoxetine to 20 mg for one week, and then increase to 40 mg (2 capsules) indefinitely. Next month, I will send you 40 mg capsules so you'll only have to take one pill   We are checking your blood today since you have a history of anemia   If you feel poorly with fast heart beat and any episode of passing out, please go straight to the emergency room   Follow up in one month (virtually or in person pending results of labs).    Be well,   Genia Hotter, M.D   [facility] [phone]  *Sign up for MyChart for instant access to your health profile, labs, orders, upcoming appointments or to contact your provider with questions*  ===================================================================================

## 2020-02-07 NOTE — Assessment & Plan Note (Signed)
Patient is doing well with mild improvement.  She is taking her 10 mg of fluoxetine.  She is agreeable to increase to 20 mg and then titrate to 40 mg next week.  Patient encouraged to follow-up with Dr. Shawnee Knapp, though she has missed appointments as it would be beneficial for her to learn tools to help with anxiety and depression.  We will follow-up with patient in 1 month for medication follow-up.  This can be done virtually if she does not require to be seen for anemia.

## 2020-02-07 NOTE — Assessment & Plan Note (Signed)
Patient's history significant for feeling tired with little energy, feeling that her heart is racing with activity.  On physical exam, she has 1 out of 6 systolic murmur but normal cap refill and does not appear pallorous.  Will obtain CBC today.  Patient agreeable to restart her iron today.  If hemoglobin is low again on recheck, will have patient come back in 2 to 3 days for repeat hematocrit to see how she is responding to oral iron.  Otherwise, can recheck hemoglobin levels in 2 to 4 weeks.

## 2020-02-07 NOTE — Assessment & Plan Note (Signed)
Patient is not sexually active and never has been sexually active.  Have discontinued HIV screening at this time.  Patient counseled on return when she becomes sexually active for screening of STDs and HIV.  She is agreeable.  Patient also received Menactra today.

## 2020-02-08 LAB — CBC WITH DIFFERENTIAL/PLATELET
Basophils Absolute: 0 10*3/uL (ref 0.0–0.2)
Basos: 1 %
EOS (ABSOLUTE): 0.1 10*3/uL (ref 0.0–0.4)
Eos: 2 %
Hematocrit: 27.3 % — ABNORMAL LOW (ref 34.0–46.6)
Hemoglobin: 8.6 g/dL — ABNORMAL LOW (ref 11.1–15.9)
Immature Grans (Abs): 0 10*3/uL (ref 0.0–0.1)
Immature Granulocytes: 0 %
Lymphocytes Absolute: 1.7 10*3/uL (ref 0.7–3.1)
Lymphs: 40 %
MCH: 23.7 pg — ABNORMAL LOW (ref 26.6–33.0)
MCHC: 31.5 g/dL (ref 31.5–35.7)
MCV: 75 fL — ABNORMAL LOW (ref 79–97)
Monocytes Absolute: 0.2 10*3/uL (ref 0.1–0.9)
Monocytes: 6 %
Neutrophils Absolute: 2.2 10*3/uL (ref 1.4–7.0)
Neutrophils: 51 %
Platelets: 274 10*3/uL (ref 150–450)
RBC: 3.63 x10E6/uL — ABNORMAL LOW (ref 3.77–5.28)
RDW: 15.8 % — ABNORMAL HIGH (ref 11.7–15.4)
WBC: 4.2 10*3/uL (ref 3.4–10.8)

## 2020-03-19 ENCOUNTER — Ambulatory Visit (INDEPENDENT_AMBULATORY_CARE_PROVIDER_SITE_OTHER): Payer: Medicaid Other | Admitting: Family Medicine

## 2020-03-19 ENCOUNTER — Other Ambulatory Visit: Payer: Self-pay

## 2020-03-19 ENCOUNTER — Other Ambulatory Visit (HOSPITAL_COMMUNITY): Payer: Self-pay | Admitting: Family Medicine

## 2020-03-19 ENCOUNTER — Encounter: Payer: Self-pay | Admitting: Family Medicine

## 2020-03-19 VITALS — BP 102/60 | HR 97 | Ht 60.0 in | Wt 124.0 lb

## 2020-03-19 DIAGNOSIS — R0981 Nasal congestion: Secondary | ICD-10-CM

## 2020-03-19 DIAGNOSIS — R253 Fasciculation: Secondary | ICD-10-CM

## 2020-03-19 DIAGNOSIS — D509 Iron deficiency anemia, unspecified: Secondary | ICD-10-CM

## 2020-03-19 DIAGNOSIS — F419 Anxiety disorder, unspecified: Secondary | ICD-10-CM

## 2020-03-19 MED ORDER — FLUTICASONE PROPIONATE 50 MCG/ACT NA SUSP: 1.0000 | Freq: Every day | NASAL | 12 refills | Status: DC

## 2020-03-19 MED ORDER — VALACYCLOVIR HCL 500 MG PO TABS: 500.0000 mg | ORAL_TABLET | Freq: Every day | ORAL | 3 refills | Status: DC

## 2020-03-19 MED ORDER — FLUOXETINE HCL 40 MG PO CAPS: 40.0000 mg | ORAL_CAPSULE | Freq: Every day | ORAL | 3 refills | Status: DC

## 2020-03-19 MED FILL — FLUTICASONE PROP 50 MCG SPR: 50 | 60 days supply | Qty: 16 | Fill #0

## 2020-03-19 MED FILL — VALACYCLOVIR HCL 500 MG TAB: 500 | 90 days supply | Qty: 90 | Fill #0

## 2020-03-19 MED FILL — FLUoxetine HCL 40 MG CAPS: 40 | 90 days supply | Qty: 90 | Fill #0

## 2020-03-19 NOTE — Patient Instructions (Signed)
Dear Vickie Bell,   It was good to see you! Thank you for taking your time to come in to be seen. Today, we discussed the following:   Anemia and Anxiety   We are rechecking some blood work today to make sure that you are anemia has improved.  We will restart the Prozac at 40 mg daily  Please continue to be mindful and question yourself when you feel worried and why you are feeling so worried  I will call you with any concerning lab results  If you are doing well, we can follow-up in 6 months for your next appointment.  If you need me any sooner, please do not hesitate to call  Be well,   Genia Hotter, M.D   [facility] [phone]  *Sign up for MyChart for instant access to your health profile, labs, orders, upcoming appointments or to contact your provider with questions*  ===================================================================================

## 2020-03-20 ENCOUNTER — Encounter: Payer: Self-pay | Admitting: Family Medicine

## 2020-03-20 DIAGNOSIS — R0981 Nasal congestion: Secondary | ICD-10-CM | POA: Insufficient documentation

## 2020-03-20 DIAGNOSIS — R253 Fasciculation: Secondary | ICD-10-CM | POA: Insufficient documentation

## 2020-03-20 LAB — COMPREHENSIVE METABOLIC PANEL
ALT: 8 IU/L (ref 0–32)
AST: 12 IU/L (ref 0–40)
Albumin/Globulin Ratio: 2 (ref 1.2–2.2)
Albumin: 4.8 g/dL (ref 3.9–5.0)
Alkaline Phosphatase: 60 IU/L (ref 39–117)
BUN/Creatinine Ratio: 23 (ref 9–23)
BUN: 16 mg/dL (ref 6–20)
Bilirubin Total: 0.2 mg/dL (ref 0.0–1.2)
CO2: 21 mmol/L (ref 20–29)
Calcium: 9.6 mg/dL (ref 8.7–10.2)
Chloride: 106 mmol/L (ref 96–106)
Creatinine, Ser: 0.69 mg/dL (ref 0.57–1.00)
GFR calc Af Amer: 146 mL/min/{1.73_m2} (ref >59)
GFR calc non Af Amer: 127 mL/min/{1.73_m2} (ref >59)
Globulin, Total: 2.4 g/dL (ref 1.5–4.5)
Glucose: 82 mg/dL (ref 65–99)
Potassium: 4.1 mmol/L (ref 3.5–5.2)
Sodium: 141 mmol/L (ref 134–144)
Total Protein: 7.2 g/dL (ref 6.0–8.5)

## 2020-03-20 LAB — CBC
Hematocrit: 35.6 % (ref 34.0–46.6)
Hemoglobin: 11.1 g/dL (ref 11.1–15.9)
MCH: 25.6 pg — ABNORMAL LOW (ref 26.6–33.0)
MCHC: 31.2 g/dL — ABNORMAL LOW (ref 31.5–35.7)
MCV: 82 fL (ref 79–97)
Platelets: 253 10*3/uL (ref 150–450)
RBC: 4.33 x10E6/uL (ref 3.77–5.28)
RDW: 20.6 % — ABNORMAL HIGH (ref 11.7–15.4)
WBC: 4.8 10*3/uL (ref 3.4–10.8)

## 2020-03-20 LAB — TSH: TSH: 0.876 u[IU]/mL (ref 0.450–4.500)

## 2020-03-20 NOTE — Assessment & Plan Note (Signed)
Repeat CBC today.  Continue iron supplements.  Patient asymptomatic

## 2020-03-20 NOTE — Assessment & Plan Note (Signed)
Patient to continue 40 mg fluoxetine with follow-up in 3 months

## 2020-03-20 NOTE — Progress Notes (Addendum)
    SUBJECTIVE:   CHIEF COMPLAINT / HPI:   Anxiety, med checkup Patient reports that she has been doing very well.  Her anxiety is improved.  She continued to do fluoxetine 40 mg and thought she was only taking it for the last 21 days of the month.  Patient does report that she felt better with the fluoxetine.  She is agreeable to start this medication chronically.  Anemia Patient reports some improvement in her heart racing.  No dizziness, no syncope.  Muscle twitching Patient reports that she has had muscle twitching in her bilateral arms near her elbows.  She reports that this has been happening more often but has been present for about a year.  She reports that this happens in her legs as well but not as frequently as her arms.  She reports that the twitching in her arms occurs throughout the day and reports it is annoying.  She denies any large myoclonic jerks, seizures.    PERTINENT  PMH / PSH: Anxiety, iron deficiency anemia  OBJECTIVE:   BP 102/60   Pulse 97   Ht 5' (1.524 m)   Wt 124 lb (56.2 kg)   LMP 02/27/2020 (Approximate)   SpO2 100%   BMI 24.22 kg/m   General: Well-appearing female, no acute distress Regular rate and rhythm, clear to auscultation bilaterally MSK: rhtyhmic twitching symmetrically of distal upper arm on (distal tricep?) and proximal/lateral muscle of lower arm (ECRL Vs brachioradialis).  Neuro: No FND. 5/5 strength bilaterally. No sensory deficits.   ASSESSMENT/PLAN:   Anxiety Patient to continue 40 mg fluoxetine with follow-up in 3 months  Anemia, iron deficiency Repeat CBC today.  Continue iron supplements.  Patient asymptomatic  Muscle twitching Unclear etiology of muscle twitching.  Mom denies any family history of neurologic issues or disease.  No seizures in the past.  Patient reports that this spontaneously occurred and has been worsening recently.  Patient denies any changes in vision, headaches, weakness or changes in sensation in her  extremities.  No facial droop no slurring of words.  The muscle twitching is present on exam and continues with very short intermittent breaks between.  It does not seem that it is stimulated by any motion.  On exam, the muscle twitching is symmetric and in small localized areas of muscle, but not entire muscle body.  Consider electrolyte abnormalities.  It is more odd that it is occurring symmetrically.  No large myoclonic jerks.  Muscle twitching is not life-threatening and patient reports is just a mild nuisance at this time.  Pending lab results and patient preference, can refer to neurology for further evaluation. We will check electrolytes and checking CBC today.  Nasal congestion Patient reports recent onset of nasal congestion due to allergies.  Will restart Flonase today.  Reviewed correct way to use medication.   Melene Plan, MD [facility]

## 2020-03-20 NOTE — Assessment & Plan Note (Signed)
Unclear etiology of muscle twitching.  Mom denies any family history of neurologic issues or disease.  No seizures in the past.  Patient reports that this spontaneously occurred and has been worsening recently.  Patient denies any changes in vision, headaches, weakness or changes in sensation in her extremities.  No facial droop no slurring of words.  The muscle twitching is present on exam and continues with very short intermittent breaks between.  It does not seem that it is stimulated by any motion.  On exam, the muscle twitching is symmetric and in small localized areas of muscle, but not entire muscle body.  Consider electrolyte abnormalities.  It is more odd that it is occurring symmetrically.  No large myoclonic jerks.  Muscle twitching is not life-threatening and patient reports is just a mild nuisance at this time.  Pending lab results and patient preference, can refer to neurology for further evaluation. We will check electrolytes and checking CBC today.

## 2020-03-20 NOTE — Assessment & Plan Note (Signed)
Patient reports recent onset of nasal congestion due to allergies.  Will restart Flonase today.  Reviewed correct way to use medication.

## 2020-06-21 ENCOUNTER — Ambulatory Visit: Payer: Medicaid Other | Admitting: Family Medicine

## 2020-06-22 ENCOUNTER — Ambulatory Visit: Payer: Medicaid Other | Admitting: Family Medicine

## 2020-10-03 ENCOUNTER — Other Ambulatory Visit: Payer: Medicaid Other

## 2020-10-03 DIAGNOSIS — Z20822 Contact with and (suspected) exposure to covid-19: Secondary | ICD-10-CM

## 2020-10-04 LAB — SARS-COV-2, NAA 2 DAY TAT

## 2020-10-04 LAB — NOVEL CORONAVIRUS, NAA: SARS-CoV-2, NAA: NOT DETECTED

## 2020-12-27 MED FILL — FLUoxetine HCL 40 MG CAPS: 40 | 90 days supply | Qty: 90 | Fill #1

## 2021-04-03 ENCOUNTER — Ambulatory Visit: Payer: Medicaid Other | Admitting: Family Medicine

## 2021-04-08 ENCOUNTER — Encounter: Payer: Self-pay | Admitting: Family Medicine

## 2021-04-08 ENCOUNTER — Ambulatory Visit (HOSPITAL_COMMUNITY)
Admission: RE | Admit: 2021-04-08 | Discharge: 2021-04-08 | Disposition: A | Discharge status: Home / Self Care | Payer: Medicaid Other | Source: Ambulatory Visit | Attending: Family Medicine | Admitting: Family Medicine

## 2021-04-08 ENCOUNTER — Ambulatory Visit (INDEPENDENT_AMBULATORY_CARE_PROVIDER_SITE_OTHER): Payer: Medicaid Other | Admitting: Family Medicine

## 2021-04-08 ENCOUNTER — Other Ambulatory Visit (HOSPITAL_COMMUNITY)
Admission: RE | Admit: 2021-04-08 | Discharge: 2021-04-08 | Disposition: A | Discharge status: Home / Self Care | Payer: Medicaid Other | Source: Ambulatory Visit | Attending: Family Medicine | Admitting: Family Medicine

## 2021-04-08 ENCOUNTER — Other Ambulatory Visit: Payer: Self-pay

## 2021-04-08 VITALS — BP 98/60 | HR 92 | Ht 60.0 in | Wt 144.6 lb

## 2021-04-08 DIAGNOSIS — R42 Dizziness and giddiness: Secondary | ICD-10-CM | POA: Insufficient documentation

## 2021-04-08 DIAGNOSIS — Z1159 Encounter for screening for other viral diseases: Secondary | ICD-10-CM | POA: Diagnosis not present

## 2021-04-08 DIAGNOSIS — Z113 Encounter for screening for infections with a predominantly sexual mode of transmission: Secondary | ICD-10-CM | POA: Insufficient documentation

## 2021-04-08 DIAGNOSIS — Z3009 Encounter for other general counseling and advice on contraception: Secondary | ICD-10-CM

## 2021-04-08 DIAGNOSIS — Z309 Encounter for contraceptive management, unspecified: Secondary | ICD-10-CM | POA: Insufficient documentation

## 2021-04-08 LAB — POCT WET PREP (WET MOUNT)
Clue Cells Wet Prep Whiff POC: NEGATIVE
Trichomonas Wet Prep HPF POC: ABSENT
WBC, Wet Prep HPF POC: >20

## 2021-04-08 NOTE — Assessment & Plan Note (Signed)
Counseled on contraception in detail. Offered patient packet information as well. Patient to make follow up appointment.

## 2021-04-08 NOTE — Assessment & Plan Note (Signed)
Obtained GC/C, wet prep, HIV, RPR. Follow up with results.  

## 2021-04-08 NOTE — Progress Notes (Signed)
    SUBJECTIVE:   CHIEF COMPLAINT / HPI:   STD Testing  Patient presenting today for STD testing. She is currently sexually active with males  and reports 1 current sexual partner.. Reports no history of STDS.  Contraception: Undecided and None Does not desire pregnancy at this time.  Denies any discharge, genital lesions, dysuria, dyspareunia.  No LMP recorded..  Denies partner violence and reports feeling safe in relationships.  Dizziness  At the end of encounter, patient asked if she could get an EKG due to having some chest pain/SOB at home over the last few days. She does have a history of anemia. She reports that her heart beats quickly. No syncope.  Denies any family history of sudden cardiac death, no personal cardiac history. Patient developed dizziness during encounter and feelings that her heart was beating fast which resolved with moving to a cooler room and drinking ice water.   PERTINENT  PMH / PSH: No hx of STD, anxiety, anemia   OBJECTIVE:  BP 98/60   Pulse 92   Ht 5' (1.524 m)   Wt 144 lb 9.6 oz (65.6 kg)   SpO2 98%   BMI 28.24 kg/m    General: well appearing, NAD, no diaphoresis, no trembling/shaking HEENT: EOMI, no pallor Chest: no respiratory distress, no wheezing Neuro: normal gait, no imbalance with walking to next exam room GU: External vulva and vagina nonerythematous, without any obvious lesions or rash. Yellowish tinted discharge appreciated.  Normal ruggae of vaginal walls.  Cervix is non erythematous and non-friable.  There is no cervical motion tenderness, masses or gross abnormalities appreciated during bimanual exam.   ASSESSMENT/PLAN:   Dizziness Patient developed some dizziness while she was in the room.  She did not appear diaphoretic, trembling or have any neurologic changes.  No respiratory distress or wheezing.  Her repeat vitals were normal.  We did obtain an EKG given she was symptomatic and that showed normal sinus rhythm. The room we were  in was quite warm and she was wearing a large hoodie. She improved when moving to a cold room and drinking ice water and removing her hooded sweatshirt.  We did observe her after getting labs as well. She had someone to drive her home.  She does have a history of anemia.  Will obtain CBC, TSH today as this could be causing her episodic dizziness and fast heart rate.  She also has a history of anxiety. She was rather nervous during the encounter today given first pelvic exam, which she tolerated well.  No concern for cardiac cause at this time given history and EKG.    Screening examination for STD (sexually transmitted disease) Obtained GC/C, wet prep, HIV, RPR. Follow up with results.    Contraceptive management Counseled on contraception in detail. Offered patient packet information as well. Patient to make follow up appointment.    Melene Plan, MD [facility]

## 2021-04-08 NOTE — Patient Instructions (Addendum)
For your dizziness, working to check some labs on you today.  You do have a history of low blood count which is also called anemia.  If you feel short of breath, very weak, have chest pain, please call us or seek emergent medical care.  I also attached some information about birth control.

## 2021-04-08 NOTE — Assessment & Plan Note (Addendum)
Patient developed some dizziness while she was in the room.  She did not appear diaphoretic, trembling or have any neurologic changes.  No respiratory distress or wheezing.  Her repeat vitals were normal.  We did obtain an EKG given she was symptomatic and that showed normal sinus rhythm. The room we were in was quite warm and she was wearing a large hoodie. She improved when moving to a cold room and drinking ice water and removing her hooded sweatshirt.  We did observe her after getting labs as well. She had someone to drive her home.  She does have a history of anemia.  Will obtain CBC, TSH today as this could be causing her episodic dizziness and fast heart rate.  She also has a history of anxiety. She was rather nervous during the encounter today given first pelvic exam, which she tolerated well.  No concern for cardiac cause at this time given history and EKG.

## 2021-04-09 ENCOUNTER — Encounter: Payer: Self-pay | Admitting: Family Medicine

## 2021-04-09 LAB — CERVICOVAGINAL ANCILLARY ONLY
Chlamydia: NEGATIVE
Comment: NEGATIVE
Comment: NORMAL
Neisseria Gonorrhea: NEGATIVE

## 2021-04-09 LAB — TSH: TSH: 1.36 u[IU]/mL (ref 0.450–4.500)

## 2021-04-09 LAB — CBC
Hematocrit: 34.7 % (ref 34.0–46.6)
Hemoglobin: 11.5 g/dL (ref 11.1–15.9)
MCH: 28.4 pg (ref 26.6–33.0)
MCHC: 33.1 g/dL (ref 31.5–35.7)
MCV: 86 fL (ref 79–97)
Platelets: 221 10*3/uL (ref 150–450)
RBC: 4.05 x10E6/uL (ref 3.77–5.28)
RDW: 13 % (ref 11.7–15.4)
WBC: 4.5 10*3/uL (ref 3.4–10.8)

## 2021-04-09 LAB — RPR: RPR Ser Ql: NONREACTIVE

## 2021-04-09 LAB — BASIC METABOLIC PANEL
BUN/Creatinine Ratio: 28 — ABNORMAL HIGH (ref 9–23)
BUN: 22 mg/dL — ABNORMAL HIGH (ref 6–20)
CO2: 20 mmol/L (ref 20–29)
Calcium: 9.8 mg/dL (ref 8.7–10.2)
Chloride: 101 mmol/L (ref 96–106)
Creatinine, Ser: 0.79 mg/dL (ref 0.57–1.00)
Glucose: 98 mg/dL (ref 65–99)
Potassium: 4.1 mmol/L (ref 3.5–5.2)
Sodium: 137 mmol/L (ref 134–144)
eGFR: 110 mL/min/{1.73_m2} (ref >59)

## 2021-04-09 LAB — HIV ANTIBODY (ROUTINE TESTING W REFLEX): HIV Screen 4th Generation wRfx: NONREACTIVE

## 2021-04-09 LAB — HEPATITIS C ANTIBODY: Hep C Virus Ab: <0.1 s/co ratio (ref 0.0–0.9)

## 2021-05-17 ENCOUNTER — Ambulatory Visit: Payer: Medicaid Other

## 2021-05-20 ENCOUNTER — Encounter: Payer: Self-pay | Admitting: Student in an Organized Health Care Education/Training Program

## 2021-05-20 ENCOUNTER — Other Ambulatory Visit: Payer: Self-pay

## 2021-05-20 ENCOUNTER — Ambulatory Visit (INDEPENDENT_AMBULATORY_CARE_PROVIDER_SITE_OTHER): Payer: Medicaid Other | Admitting: Student in an Organized Health Care Education/Training Program

## 2021-05-20 DIAGNOSIS — L739 Follicular disorder, unspecified: Secondary | ICD-10-CM | POA: Insufficient documentation

## 2021-05-20 NOTE — Assessment & Plan Note (Signed)
Appears to be clogged hair follicle in peri-rectal area. Do not suspect tract with rectum based on history and exam.  Recommended warm compresses and normal skin hygiene products to wash area.  Also consider early abscess or HS. Gave patient monitoring and return precautions.

## 2021-05-20 NOTE — Patient Instructions (Signed)
It was a pleasure to see you today!  To summarize our discussion for this visit: It looks like the bump near your anal opening is likely a clogged hair follicle but does not seem to have a large infection or abscess. You can clean the area with soap and water like usual and also try warm compresses to help the follicle open.  If you see these occurring regularly, you can try using hibiclens or return to see your doctor to consider another topical cleanser with antibiotics if needed.  For the hemorrhoid, I would recommend taking a daily fiber supplement to avoid straining while having bowel movements.   Some additional health maintenance measures we should update are: Health Maintenance Due  Topic Date Due   HPV VACCINES (1 - 2-dose series) Never done     Please return to our clinic to see me as needed.  Call the clinic at [phone] if your symptoms worsen or you have any concerns.   Thank you for allowing me to take part in your care,  Dr. Jamelle Rushing

## 2021-05-20 NOTE — Progress Notes (Signed)
   SUBJECTIVE:   CHIEF COMPLAINT / HPI: bump in anal area  Anal lesion-  2 weeks ago felt it when moving while getting up. Doesn't remember having something like this before. Hurts when press down on it hard but otherwise does not bother her at baseline.  Located "inside crack", close by anal opening.  No bleeding.  Not growing or changing since first noticed Not itchy.  Not draining.  Sometimes strains but denies constipation and has regular Bms. Has history of constipation. Sexually active but no anal intercourse.  She otherwise feels well.  OBJECTIVE:   BP 100/60   Pulse (!) 104   Ht 5' (1.524 m)   Wt 149 lb (67.6 kg)   LMP 05/04/2021   SpO2 98%   BMI 29.10 kg/m   Physical Exam Vitals and nursing note reviewed. Exam conducted with a chaperone present.  Constitutional:      General: She is not in acute distress.    Appearance: Normal appearance. She is normal weight. She is not ill-appearing, toxic-appearing or diaphoretic.  Cardiovascular:     Rate and Rhythm: Normal rate and regular rhythm.  Pulmonary:     Effort: Pulmonary effort is normal.  Genitourinary:    Rectum: External hemorrhoid present. No tenderness.     Comments: Approximately 2 inches from rectum in 7 oclock position- there is a pronounced follicle with approximately 74mm well-circumscribed mass which is soft and superficial. Non-tender and non-erythematous. No drainage with expression. Do not appreciate any tract. Skin:    General: Skin is warm and dry.     Capillary Refill: Capillary refill takes less than 2 seconds.  Neurological:     General: No focal deficit present.     Mental Status: She is alert and oriented to person, place, and time.  Psychiatric:        Mood and Affect: Mood normal.        Behavior: Behavior normal.    ASSESSMENT/PLAN:   Folliculitis Appears to be clogged hair follicle in peri-rectal area. Do not suspect tract with rectum based on history and exam.  Recommended warm  compresses and normal skin hygiene products to wash area.  Also consider early abscess or HS. Gave patient monitoring and return precautions.      Leeroy Bock, DO [facility]

## 2021-07-03 ENCOUNTER — Other Ambulatory Visit: Payer: Self-pay | Admitting: Family Medicine

## 2021-07-04 ENCOUNTER — Other Ambulatory Visit: Payer: Self-pay | Admitting: Family Medicine

## 2021-07-04 ENCOUNTER — Other Ambulatory Visit (HOSPITAL_COMMUNITY): Payer: Self-pay

## 2021-07-04 ENCOUNTER — Telehealth: Payer: Medicaid Other | Admitting: Physician Assistant

## 2021-07-04 ENCOUNTER — Encounter: Payer: Self-pay | Admitting: Physician Assistant

## 2021-07-04 DIAGNOSIS — R002 Palpitations: Secondary | ICD-10-CM

## 2021-07-04 NOTE — Patient Instructions (Signed)
  [name], thank you for joining Karrie Meres, PA-C for today's virtual visit.  While this provider is not your primary care provider (PCP), if your PCP is located in our provider database this encounter information will be shared with them immediately following your visit.  Consent: (Patient) Vickie Bell provided verbal consent for this virtual visit at the beginning of the encounter.  Current Medications:  Current Outpatient Medications:    ferrous sulfate 325 (65 FE) MG EC tablet, Take 2 tablets (650 mg total) by mouth every Monday, Wednesday, and Friday. Take 1 tab between meals. A total of 2 tabs a day on MWF., Disp: 60 tablet, Rfl: 3   FLUoxetine (PROZAC) 40 MG capsule, Take 1 capsule (40 mg total) by mouth daily., Disp: 90 capsule, Rfl: 3   FLUoxetine (PROZAC) 40 MG capsule, TAKE 1 CAPSULE (40 MG TOTAL) BY MOUTH DAILY., Disp: 90 capsule, Rfl: 3   fluticasone (FLONASE) 50 MCG/ACT nasal spray, Place 1 spray into both nostrils daily. 1 spray in each nostril every day, Disp: 16 g, Rfl: 12   Medications ordered in this encounter:  No orders of the defined types were placed in this encounter.    *If you need refills on other medications prior to your next appointment, please contact your pharmacy*  Follow-Up:I would recommend you follow up at urgent care today. You will need an EKG and may need to have lab work completed given your history of anemia.   If you have been instructed to have an in-person evaluation today at a local Urgent Care facility, please use the link below. It will take you to a list of all of our available [facility], including address, phone number and hours of operation. Please do not delay care.  [facility]  If you or a family member do not have a primary care provider, use the link below to schedule a visit and establish care. When you choose a [facility] primary care physician or advanced practice provider, you gain a  long-term partner in health. Find a Primary Care Provider  Learn more about [facility]'s in-office and virtual care options: [facility] - Get Care Now

## 2021-07-04 NOTE — Progress Notes (Signed)
Ms. Vickie Bell, Vickie Bell are scheduled for a virtual visit with your provider today.    Just as we do with appointments in the office, we must obtain your consent to participate.  Your consent will be active for this visit and any virtual visit you may have with one of our providers in the next 365 days.    If you have a MyChart account, I can also send a copy of this consent to you electronically.  All virtual visits are billed to your insurance company just like a traditional visit in the office.  As this is a virtual visit, video technology does not allow for your provider to perform a traditional examination.  This may limit your provider's ability to fully assess your condition.  If your provider identifies any concerns that need to be evaluated in person or the need to arrange testing such as labs, EKG, etc, we will make arrangements to do so.    Although advances in technology are sophisticated, we cannot ensure that it will always work on either your end or our end.  If the connection with a video visit is poor, we may have to switch to a telephone visit.  With either a video or telephone visit, we are not always able to ensure that we have a secure connection.   I need to obtain your verbal consent now.   Are you willing to proceed with your visit today?   Vickie Bell has provided verbal consent on 07/04/2021 for a virtual visit (video or telephone).   Karrie Meres, PA-C 07/04/2021  12:06 PM  Vickie Bell,you are scheduled for a virtual visit with your provider today.    Just as we do with appointments in the office, we must obtain your consent to participate.  Your consent will be active for this visit and any virtual visit you may have with one of our providers in the next 365 days.    If you have a MyChart account, I can also send a copy of this consent to you electronically.  All virtual visits are billed to your insurance company just like a traditional visit in the office.  As this  is a virtual visit, video technology does not allow for your provider to perform a traditional examination.  This may limit your provider's ability to fully assess your condition.  If your provider identifies any concerns that need to be evaluated in person or the need to arrange testing such as labs, EKG, etc, we will make arrangements to do so.    Although advances in technology are sophisticated, we cannot ensure that it will always work on either your end or our end.  If the connection with a video visit is poor, we may have to switch to a telephone visit.  With either a video or telephone visit, we are not always able to ensure that we have a secure connection.   I need to obtain your verbal consent now.   Are you willing to proceed with your visit today?   Vickie Bell has provided verbal consent on 07/04/2021 for a virtual visit (video or telephone).   Karrie Meres, PA-C 07/04/2021  12:06 PM   Date:  07/04/2021   ID:  Vickie Bell, DOB [birth year], MRN [id number]  Patient Location: Home Provider Location: Home Office   Participants: Patient and Provider for Visit and Wrap up  Method of visit: Video  Location of Patient: Home Location of Provider: Home Office Consent was obtain for visit  over the video. Services rendered by provider: Visit was performed via video  A video enabled telemedicine application was used and I verified that I am speaking with the correct person using two identifiers.  PCP:  Darral Dash, DO   Chief Complaint:  shakiness  History of Present Illness:    Vickie Bell is a 20 y.o. female with history as stated below. Presents video telehealth for an acute care visit  States she has been feeling shakiness and palpitations for the last few days. Denies new medications, dietary changes, decreased po intake, nvd. Denies Numbness, weakness, fevers, chills, chest pain, sob.  States she has a history of anxiety but has not been feeling more  anxious than normal.  Denies caffeine use.  Past Medical, Surgical, Social History, Allergies, and Medications have been Reviewed.  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     Allergies:   Patient has no known allergies.   ROS See HPI for history of present illness.  Physical Exam Vitals and nursing note reviewed.  Constitutional:      General: She is not in acute distress.    Appearance: She is well-developed.  Eyes:     Conjunctiva/sclera: Conjunctivae normal.  Pulmonary:     Effort: Pulmonary effort is normal.  Musculoskeletal:     Cervical back: Neck supple.  Neurological:     Mental Status: She is alert.            MDM: Pt reports shakiness and palpitations. Recommended evaluation at urgent care for an EKG and possibly further workup as evaluation of this complaint is limited on a virtual visit.   There are no diagnoses linked to this encounter.   Time:   Today, I have spent 13 minutes with the patient with telehealth technology discussing the above problems, reviewing the chart, previous notes, medications and orders.    Tests Ordered: No orders of the defined types were placed in this encounter.   Medication Changes: No orders of the defined types were placed in this encounter.    Disposition:  Follow up  Signed, Karrie Meres, PA-C  07/04/2021 12:06 PM

## 2021-07-05 ENCOUNTER — Other Ambulatory Visit (HOSPITAL_COMMUNITY): Payer: Self-pay

## 2021-07-05 MED ORDER — FLUOXETINE HCL 40 MG PO CAPS
40.0000 mg | ORAL_CAPSULE | Freq: Every day | ORAL | 0 refills | Status: DC
  Filled 2021-07-05: qty 90, 90d supply, fill #0

## 2021-07-11 ENCOUNTER — Other Ambulatory Visit: Payer: Self-pay

## 2021-07-11 ENCOUNTER — Other Ambulatory Visit (HOSPITAL_COMMUNITY): Payer: Self-pay

## 2021-07-11 ENCOUNTER — Ambulatory Visit (INDEPENDENT_AMBULATORY_CARE_PROVIDER_SITE_OTHER): Payer: Medicaid Other | Admitting: Family Medicine

## 2021-07-11 VITALS — BP 120/60 | HR 86 | Ht 63.0 in | Wt 147.2 lb

## 2021-07-11 DIAGNOSIS — N898 Other specified noninflammatory disorders of vagina: Secondary | ICD-10-CM | POA: Diagnosis not present

## 2021-07-11 DIAGNOSIS — F419 Anxiety disorder, unspecified: Secondary | ICD-10-CM

## 2021-07-11 MED ORDER — HYDROXYZINE PAMOATE 25 MG PO CAPS
25.0000 mg | ORAL_CAPSULE | Freq: Three times a day (TID) | ORAL | 0 refills | Status: DC | PRN
  Filled 2021-07-11: qty 30, 10d supply, fill #0

## 2021-07-11 NOTE — Patient Instructions (Addendum)
It was good to see you today.  Thank you for coming in.  I believe your symptoms are due to anxiety.   I am prescribing a medication called Vistaril.  You can take this up to 3 times a day as needed for when you are feeling symptoms.  It can have side effects of sleepiness, so do not take before driving your car.  I would like you to come see your main doctor in 1 month to help with your anxiety further in 1 month.  Be Well, Dr Pecola Leisure

## 2021-07-12 NOTE — Progress Notes (Signed)
    SUBJECTIVE:   CHIEF COMPLAINT / HPI:   Patient coming in for fast heart beat and heart palpitations.  Indicates episodes will occur throughout the day at random times.  Indicates happen when lying down or up moving around.  Denies any physical exertion. Indicates does have history of anxiety for which she is currently taking Fluoxetine.  Denies any significant new stressors and cannot report any specific anxiety triggers.     PERTINENT  PMH / PSH: Anxiety  OBJECTIVE:   BP 120/60   Pulse 86   Ht 5\' 3"  (1.6 m)   Wt 147 lb 4 oz (66.8 kg)   LMP 07/01/2021   SpO2 96%   BMI 26.08 kg/m    Physical Exam Constitutional:      General: She is not in acute distress.    Appearance: She is not ill-appearing.  HENT:     Head: Normocephalic and atraumatic.  Cardiovascular:     Rate and Rhythm: Normal rate and regular rhythm.     Pulses: Normal pulses.  Pulmonary:     Effort: Pulmonary effort is normal. No respiratory distress.     Breath sounds: Normal breath sounds.  Neurological:     Mental Status: She is alert.  Psychiatric:        Mood and Affect: Mood normal.        Behavior: Behavior normal.     ASSESSMENT/PLAN:   Anxiety Likely anxiety related.  Had previous visit for similar symptoms in April and CBC, TSH and EKG all normal at this time.  Given age and lack of significant comorbidities, unlikely cardiovascular issue and do not feel further testing/imaging warranted. - Prescribed Vistaril 10 mg TID PRN, discussed side effects and avoiding operating heavy machinery after taking - follow up with PCP for medication management and discussion on behavioral health therapy     May, MD [facility]

## 2021-07-12 NOTE — Assessment & Plan Note (Signed)
Likely anxiety related.  Had previous visit for similar symptoms in April and CBC, TSH and EKG all normal at this time.  Given age and lack of significant comorbidities, unlikely cardiovascular issue and do not feel further testing/imaging warranted. - Prescribed Vistaril 10 mg TID PRN, discussed side effects and avoiding operating heavy machinery after taking - follow up with PCP for medication management and discussion on behavioral health therapy

## 2021-08-12 ENCOUNTER — Ambulatory Visit: Payer: Medicaid Other | Admitting: Student

## 2021-08-12 NOTE — Progress Notes (Unsigned)
    SUBJECTIVE:   Chief compliant/HPI: annual examination  Vickie Bell is a 20 y.o. who presents today for an annual exam.   Review of systems form notable for ***.   Updated history tabs and problem list ***.   OBJECTIVE:   There were no vitals taken for this visit.  ***  ASSESSMENT/PLAN:   No problem-specific Assessment & Plan notes found for this encounter.    Annual Examination  See AVS for age appropriate recommendations.   PHQ score ***, reviewed and discussed. Blood pressure reviewed and at goal ***.  Asked about intimate partner violence and patient reports ***.  The patient currently uses *** for contraception. Folate recommended as appropriate, minimum of 400 mcg per day.  Advanced directives ***   Considered the following items based upon USPSTF recommendations: HIV testing: {discussed/ordered:14545} Hepatitis C: {discussed/ordered:14545} Hepatitis B: {discussed/ordered:14545} Syphilis if at high risk: {discussed/ordered:14545} GC/CT {GC/CT screening :23818} Lipid panel (nonfasting or fasting) discussed based upon AHA recommendations and {ordered not order:23822}.  Consider repeat every 4-6 years.  Reviewed risk factors for latent tuberculosis and {not indicated/requested/declined:14582}  Discussed family history, BRCA testing {not indicated/requested/declined:14582}. Tool used to risk stratify was Pedigree Assessment tool ***  Cervical cancer screening: {PAPTYPE:23819} Immunizations ***   Follow up in 1  *** year or sooner if indicated.    Orvis Brill, [place]

## 2021-08-29 ENCOUNTER — Ambulatory Visit (INDEPENDENT_AMBULATORY_CARE_PROVIDER_SITE_OTHER): Payer: Medicaid Other | Admitting: Student

## 2021-08-29 ENCOUNTER — Other Ambulatory Visit: Payer: Self-pay

## 2021-08-29 ENCOUNTER — Other Ambulatory Visit (HOSPITAL_COMMUNITY): Payer: Self-pay

## 2021-08-29 ENCOUNTER — Encounter: Payer: Self-pay | Admitting: Student

## 2021-08-29 VITALS — BP 119/75 | HR 87 | Ht 63.0 in | Wt 148.8 lb

## 2021-08-29 DIAGNOSIS — F419 Anxiety disorder, unspecified: Secondary | ICD-10-CM | POA: Diagnosis not present

## 2021-08-29 DIAGNOSIS — D509 Iron deficiency anemia, unspecified: Secondary | ICD-10-CM | POA: Diagnosis not present

## 2021-08-29 DIAGNOSIS — J309 Allergic rhinitis, unspecified: Secondary | ICD-10-CM | POA: Insufficient documentation

## 2021-08-29 MED ORDER — FLUTICASONE PROPIONATE 50 MCG/ACT NA SUSP
2.0000 | Freq: Every day | NASAL | 6 refills | Status: DC
  Filled 2021-08-29: qty 16, 30d supply, fill #0

## 2021-08-29 MED ORDER — FLUOXETINE HCL 20 MG PO CAPS
20.0000 mg | ORAL_CAPSULE | Freq: Every day | ORAL | 2 refills | Status: DC
  Filled 2021-08-29: qty 30, 30d supply, fill #0

## 2021-08-29 MED ORDER — FLUOXETINE HCL 20 MG PO TABS
20.0000 mg | ORAL_TABLET | Freq: Every day | ORAL | 3 refills | Status: DC
  Filled 2021-08-29: qty 30, 30d supply, fill #0

## 2021-08-29 NOTE — Assessment & Plan Note (Signed)
Patient asymptomatic. Continue iron supplements. Last Hgb 11.5, stable from prior.

## 2021-08-29 NOTE — Progress Notes (Signed)
    SUBJECTIVE:   CHIEF COMPLAINT / HPI:   Anxious mood PHQ-9 and GAD-7 are 3 and 8. Coping includes . Has social support including . Marland Kitchen She was started on Hydroxyzine 10mg  TID by Dr. on 07/11/21 and says she took it around 3 times and it makes her sleepy, and increases her sleep quality. Says she is a "hypochondriac" and has a lot of health concerns and worries . She worries if she runs or works out too hard, she will pass out or have a cardiac event. Takes Fluoxetine 40mg  daily. No suicidal ideation or thoughts of harming herself.  History of iron-deficiency anemia She has been taking iron daily at home. Had some mild dizziness with standing 2 weeks ago. No racing heart. Also has shakiness. Denies falls or syncope. Last CBC 4 months ago (04/08/21) within normal limits with Hgb 11.5.   Allergies Gets runny nose/sneezing most days with congestion.  No wheezing, cough or difficulty breathing. No pets at home. Goes to boyfriend's house a lot who has a dog. At night or during  PERTINENT  PMH / PSH: Anxiety  OBJECTIVE:   BP 119/75   Pulse 87   Ht 5\' 3"  (1.6 m)   Wt 148 lb 12.8 oz (67.5 kg)   LMP 08/02/2021   SpO2 100%   BMI 26.36 kg/m   General: Well-appearing, in no distress Eyes: PERRLA, EOMI, no conjunctival pallor Mouth: No pharyngeal erythema. No tongue pallor Cardiac: Regular rate and rhythm, no murmurs appreciated Lungs: Clear in all fields. Good aeration throughout. Ext: Well-perfused, capillary refill <2 seconds. No nail bed pallor Psych: Normal mood and affect. Thought process normal  ASSESSMENT/PLAN:   Anxiety GAD-7 improved from last visit, 8 today. Anxiety largely surrounding health and fear of cardiac event with exercise.  Reassured patient that this is unlikely given her age and health status. All recent labs and EKG within normal limits. Shakiness/anxiety could also be related to her eating habits, she usually only has one meal per day. - Encouraged snacking  at regular intervals and eating 3 meals per day to keep blood glucose stabilized and reduce anxiety - Increased Fluoxetine from 40mg  daily to 60mg  daily - Continue Vistaril 10mg  TID PRN - Encouraged her to make appointment with therapist for counseling which in conjunction with medication will reduce anxiety  Anemia, iron deficiency Patient asymptomatic. Continue iron supplements. Last Hgb 11.5, stable from prior.  Allergic rhinitis Likely related to dust, pollen, dog at boyfriend's house. Patient has used Flonase in the past, will send in prescription for this today. Educated on proper use and to aim nozzle away from septum with use.     06/08/21, DO [facility]

## 2021-08-29 NOTE — Assessment & Plan Note (Signed)
Likely related to dust, pollen, dog at boyfriend's house. Patient has used Flonase in the past, will send in prescription for this today. Educated on proper use and to aim nozzle away from septum with use.

## 2021-08-29 NOTE — Patient Instructions (Signed)
It was great to see you! Thank you for allowing me to participate in your care!  Start taking 60mg  Fluoxetine daily (One 40mg  tablet and one 20mg  tablet)  Start using Flonase as we discussed, one puff per nostril once daily.  Call to schedule an appointment with Dr. (psychologist).  Take care and seek immediate care sooner if you develop any concerns.   Dr. , DO [facility]

## 2021-08-29 NOTE — Assessment & Plan Note (Signed)
GAD-7 improved from last visit, 8 today. Anxiety largely surrounding health and fear of cardiac event with exercise.  Reassured patient that this is unlikely given her age and health status. All recent labs and EKG within normal limits. Shakiness/anxiety could also be related to her eating habits, she usually only has one meal per day. - Encouraged snacking at regular intervals and eating 3 meals per day to keep blood glucose stabilized and reduce anxiety - Increased Fluoxetine from 40mg  daily to 60mg  daily - Continue Vistaril 10mg  TID PRN - Encouraged her to make appointment with therapist for counseling which in conjunction with medication will reduce anxiety

## 2021-09-03 ENCOUNTER — Telehealth: Payer: Self-pay | Admitting: Psychology

## 2021-09-03 NOTE — Telephone Encounter (Signed)
Returned pt call to schedule appt. Appt scheduled for 10/13 at 230  Royetta Asal, PhD., LMFT

## 2021-11-05 ENCOUNTER — Other Ambulatory Visit: Payer: Self-pay

## 2021-11-05 ENCOUNTER — Ambulatory Visit (INDEPENDENT_AMBULATORY_CARE_PROVIDER_SITE_OTHER): Payer: Medicaid Other

## 2021-11-05 DIAGNOSIS — Z23 Encounter for immunization: Secondary | ICD-10-CM | POA: Diagnosis not present

## 2021-11-05 NOTE — Progress Notes (Signed)
Patient presents to nurse clinic for flu vaccination. Administered in RD. Site unremarkable. Tolerated injection well.   Tamela Oddi, MA student

## 2022-02-07 ENCOUNTER — Other Ambulatory Visit: Payer: Self-pay

## 2022-02-07 ENCOUNTER — Ambulatory Visit (INDEPENDENT_AMBULATORY_CARE_PROVIDER_SITE_OTHER): Payer: Medicaid Other | Admitting: Student

## 2022-02-07 ENCOUNTER — Other Ambulatory Visit (HOSPITAL_COMMUNITY): Payer: Self-pay

## 2022-02-07 ENCOUNTER — Encounter: Payer: Self-pay | Admitting: Student

## 2022-02-07 ENCOUNTER — Other Ambulatory Visit (HOSPITAL_COMMUNITY)
Admission: RE | Admit: 2022-02-07 | Discharge: 2022-02-07 | Disposition: A | Discharge status: Home / Self Care | Payer: Medicaid Other | Source: Ambulatory Visit | Attending: Family Medicine | Admitting: Family Medicine

## 2022-02-07 VITALS — BP 125/72 | HR 90 | Ht 63.0 in | Wt 159.2 lb

## 2022-02-07 DIAGNOSIS — D509 Iron deficiency anemia, unspecified: Secondary | ICD-10-CM | POA: Diagnosis not present

## 2022-02-07 DIAGNOSIS — Z113 Encounter for screening for infections with a predominantly sexual mode of transmission: Secondary | ICD-10-CM

## 2022-02-07 DIAGNOSIS — Z23 Encounter for immunization: Secondary | ICD-10-CM

## 2022-02-07 DIAGNOSIS — R002 Palpitations: Secondary | ICD-10-CM | POA: Insufficient documentation

## 2022-02-07 DIAGNOSIS — Z0000 Encounter for general adult medical examination without abnormal findings: Secondary | ICD-10-CM

## 2022-02-07 DIAGNOSIS — Z124 Encounter for screening for malignant neoplasm of cervix: Secondary | ICD-10-CM | POA: Diagnosis not present

## 2022-02-07 LAB — POCT WET PREP (WET MOUNT)
Clue Cells Wet Prep Whiff POC: NEGATIVE
Trichomonas Wet Prep HPF POC: ABSENT
WBC, Wet Prep HPF POC: >20

## 2022-02-07 MED ORDER — AZELASTINE HCL 0.1 % NA SOLN
1.0000 | Freq: Two times a day (BID) | NASAL | 12 refills | Status: DC | PRN
  Filled 2022-02-07: qty 30, 34d supply, fill #0
  Filled 2022-11-11: qty 30, 34d supply, fill #1

## 2022-02-07 MED ORDER — FERROUS SULFATE 325 (65 FE) MG PO TBEC
325.0000 mg | DELAYED_RELEASE_TABLET | ORAL | 3 refills | Status: DC
  Filled 2022-02-07: qty 60, fill #0
  Filled 2022-11-11: qty 12, 28d supply, fill #0
  Filled 2022-11-13: qty 100, 234d supply, fill #0

## 2022-02-07 NOTE — Assessment & Plan Note (Addendum)
Asymptomatic. Baseline Hgb around 11. Encouraged patient to take PO ferrous sulfate as prescribed MWF. CBC ordered today. ?

## 2022-02-07 NOTE — Assessment & Plan Note (Addendum)
Continued symptoms similar from prior visits with palpitations. Given her young age and general good health status,  I am not concerned for a serious cardiac condition. No family hx of sudden cardiac death. No excessive caffeine use. EKG reviewed from prior visits all normal sinus rhythm or sinus tachycardia without evidence of abnormality including WPW, atrial or ventricular arrhythmias, bundle branch blocks, or ST or T wave changes. She could be experiencing PVCs that would explain her symptoms, or palpitations secondary to anemia or stress. Less likely due to hyperthyroidism.  Reassurance provided to patient. Encouraged her to stay well-hydrated, take PO iron pills for her anemia, eat balanced meals and work on keeping anxiety and depression at bay. ?Return precautions discussed. ?

## 2022-02-07 NOTE — Progress Notes (Signed)
? ? ?SUBJECTIVE:  ? ?Chief compliant/HPI: annual examination ? ?Vickie Bell is a pleasant 21 y.o. female with a history of iron-deficiency anemia, depression/anxiety, seasonal allergies who presents today for an annual exam.  ? ?Encounter for annual exam ?Never had PAP, now meets crtieria for age 83 or older.  ?LMP on 3/2, lasted 5 days, no excessive pain or menorrhagia.  ?Has one sexual partner in a monogamous relationship with boyfriend. Feels safe. ?Uses condoms for birth control, considering other forms including IUD or Nexplanon. ? ?Palpitations ?Only voiced concern at today's visit is heart feeling like it is "dropping." Denies syncope, pre-syncope, chest pain. Has had ongoing palpitations for years. Worsened with laying down in certain positions. ? ?Iron-deficiency anemia ?Has not been taking PO ferrous sulfate as prescribed. Denies shortness of breath, tachycardia, feeling cold. ? ?Anxiety ?Largely health related. She says "I am a hypochondriac." Denies SI. ? ?Social Hx: Denies cigarette/vaping use, alcohol use, illicit substance use including marijuana.  ? ?Updated history tabs and problem list.  ? ?OBJECTIVE:  ? ?BP 125/72   Pulse 90   Ht 5\' 3"  (1.6 m)   Wt 159 lb 3.2 oz (72.2 kg)   LMP 01/30/2022   SpO2 100%   BMI 28.20 kg/m?   ?General: Well-appearing female, appears stated age, in no distress ?CV: Regular rate and rhythm. No murmurs appreciated. ?Respiratory: Normal work of breathing on room air ?Abdomen: Soft, non-tender, non-distended ?GU: Chaperone-Michelle Simpson, CMA present. Normal female external genitalia without vesicles or lesions. Normal ruggae of vaginal walls. Cervix slightly friable with moderate yellow-ish discharge. No cervical motion tenderness. Pap performed. ? ?ASSESSMENT/PLAN:  ? ?Anemia, iron deficiency ?Asymptomatic. Baseline Hgb around 11. Encouraged patient to take PO ferrous sulfate as prescribed MWF. CBC ordered today. ? ?Palpitations ?Continued symptoms similar  from prior visits with palpitations. Given her young age and general good health status,  I am not concerned for a serious cardiac condition. No family hx of sudden cardiac death. EKG reviewed from prior visits all normal sinus rhythm or sinus tachycardia without evidence of abnormality including WPW, atrial or ventricular arrhythmias, bundle branch blocks, or ST or T wave changes. She could be experiencing PVCs that would explain her symptoms. Reassurance provided to patient. Encouraged her to stay well-hydrated, take PO iron pills for her anemia, eat balanced meals and work on keeping anxiety and depression at bay. ?Return precautions discussed. ? ? ?Annual Examination  ?See AVS for age appropriate recommendations- discussed continuing with exercise, eating balanced meals and continuing to avoid tobacco use. ? ?-PHQ score 6, reviewed and discussed. On Prozac which helps. ?-Blood pressure reviewed  (125/72) and at goal.  ?-The patient currently uses condoms for contraception. Will come back to decide on contraception. Discussed various options and benefits/risks including OCP, IUD, Nexplanon. Patient will think on it. ?-Pap smear performed today with G/C, Trichomonas and wet prep. Will f/u results. ? ?Considered the following items based upon USPSTF recommendations: ?HIV testing: discussed ?Hepatitis C:  already completed, negative. ?GC/CT  ordered ?Lipid panel (nonfasting or fasting) discussed based upon AHA recommendations and not ordered.  Consider repeat every 4-6 years.  ?Reviewed risk factors for latent tuberculosis and not indicated ? ?Cervical cancer screening: due for Pap today, cytology alone ordered (HPV if ASCUS) ? ?Immunizations HPV vaccine administered today, although on prior review in [facility] [place] vaccination databse, she had already completed 3-dose series. Called patient to let her know. Should not cause any harm/adverse effects other than injection site reaction. EMR updated to  reflect vaccination  status with series.  ? ?Follow up in 1  year or sooner if indicated.  ? ?Darral Dash, DO ?[facility]   ?

## 2022-02-07 NOTE — Patient Instructions (Addendum)
It was great seeing you today. Keep doing a great job with your health by not smoking and coming to your regular appointments! ? ?Today, we completed a pelvic exam with a Pap Smear (cervical cancer screening) as well as STI testing.  ?We also collected blood work to check your hemoglobin (which is low in anemia). ?If any of your tests are abnormal, I will call you. ? ?I believe what you are experiencing with your heart palpitations is benign, potentially worsened by stress/anxiety and anemia. Your exam today was normal and your prior EKGs have all been normal. Try stress reduction, taking your iron pills as prescribed, and exercising regularly. Should you experience feelings of passing out, chest pain, please seek urgent medical care. ? ?I sent in the nasal spray "Azelastine" to help with your allergies to the dog. ? ?If any of your tests are abnormal, I will call you. ? ?We will plan to see you again next year unless you have any other concerns. ?  ?If you have any questions or concerns, please feel free to call the clinic.  ?  ?Be well,  ?Dr. Darral Dash ?[facility] ?[phone]  ?

## 2022-02-08 LAB — CBC
Hematocrit: 34.8 % (ref 34.0–46.6)
Hemoglobin: 11.4 g/dL (ref 11.1–15.9)
MCH: 28.5 pg (ref 26.6–33.0)
MCHC: 32.8 g/dL (ref 31.5–35.7)
MCV: 87 fL (ref 79–97)
Platelets: 206 10*3/uL (ref 150–450)
RBC: 4 x10E6/uL (ref 3.77–5.28)
RDW: 13.4 % (ref 11.7–15.4)
WBC: 4.5 10*3/uL (ref 3.4–10.8)

## 2022-02-08 LAB — HIV ANTIBODY (ROUTINE TESTING W REFLEX): HIV Screen 4th Generation wRfx: NONREACTIVE

## 2022-02-11 LAB — CYTOLOGY - PAP
Chlamydia: NEGATIVE
Comment: NEGATIVE
Comment: NEGATIVE
Comment: NORMAL
Diagnosis: NEGATIVE
Neisseria Gonorrhea: NEGATIVE
Trichomonas: NEGATIVE

## 2022-02-25 ENCOUNTER — Emergency Department (HOSPITAL_BASED_OUTPATIENT_CLINIC_OR_DEPARTMENT_OTHER)
Admission: EM | Admit: 2022-02-25 | Discharge: 2022-02-25 | Disposition: A | Discharge status: Home / Self Care | Payer: Medicaid Other | Attending: Emergency Medicine | Admitting: Emergency Medicine

## 2022-02-25 ENCOUNTER — Other Ambulatory Visit: Payer: Self-pay

## 2022-02-25 ENCOUNTER — Encounter (HOSPITAL_BASED_OUTPATIENT_CLINIC_OR_DEPARTMENT_OTHER): Payer: Self-pay | Admitting: Emergency Medicine

## 2022-02-25 DIAGNOSIS — U071 COVID-19: Secondary | ICD-10-CM | POA: Insufficient documentation

## 2022-02-25 DIAGNOSIS — R059 Cough, unspecified: Secondary | ICD-10-CM | POA: Diagnosis present

## 2022-02-25 LAB — RESP PANEL BY RT-PCR (FLU A&B, COVID) ARPGX2
Influenza A by PCR: NEGATIVE
Influenza B by PCR: NEGATIVE
SARS Coronavirus 2 by RT PCR: POSITIVE — AB

## 2022-02-25 LAB — GROUP A STREP BY PCR: Group A Strep by PCR: NOT DETECTED

## 2022-02-25 NOTE — ED Triage Notes (Signed)
Pt presents with cough/congestion /sore throat for 4 days, denies fever.  ?

## 2022-02-25 NOTE — ED Notes (Signed)
Dc instructions reviewed with patient. Patient voiced understanding. Dc with belongings.  °

## 2022-02-25 NOTE — ED Provider Notes (Signed)
?[facility] EMERGENCY DEPT ?Provider Note ? ? ?CSN: [id number] ?Arrival date & time: 02/25/22  1042 ? ?  ? ?History ? ?Chief Complaint  ?Patient presents with  ? Cough  ? ? ?Vickie Bell is a 21 y.o. female. ? ?21 year old female presents with 4 days of URI symptoms.  No fever or vomiting.  Slight congestion mild sore throat.  Has not used any over-the-counter medications.  Endorses positive sick contact ? ? ?  ? ?Home Medications ?Prior to Admission medications   ?Medication Sig Start Date End Date Taking? Authorizing Provider  ?azelastine (ASTELIN) 0.1 % nasal spray Place 1 spray into both nostrils 2 (two) times daily as needed for rhinitis. Use in each nostril as directed 02/07/22   Darral Dash, DO  ?ferrous sulfate 325 (65 FE) MG EC tablet Take 1 tablet (325 mg total) by mouth 3 (three) times a week on Monday, Wednesday, and Friday 02/07/22   Darral Dash, DO  ?FLUoxetine (PROZAC) 40 MG capsule Take 1 capsule (40 mg total) by mouth daily. 07/05/21   Dameron, Nolberto Hanlon, DO  ?fluticasone (FLONASE) 50 MCG/ACT nasal spray Place 2 sprays into both nostrils daily. 08/29/21   Dameron, Nolberto Hanlon, DO  ?hydrOXYzine (VISTARIL) 25 MG capsule Take 1 capsule (25 mg total) by mouth 3 (three) times daily as needed. 07/11/21   Jovita Kussmaul, MD  ?   ? ?Allergies    ?Patient has no known allergies.   ? ?Review of Systems   ?Review of Systems  ?All other systems reviewed and are negative. ? ?Physical Exam ?Updated Vital Signs ?BP 138/81 (BP Location: Right Arm)   Pulse (!) 105   Temp 98.4 ?F (36.9 ?C)   Resp 16   LMP 01/30/2022   SpO2 100%  ?Physical Exam ?Vitals and nursing note reviewed.  ?Constitutional:   ?   General: She is not in acute distress. ?   Appearance: Normal appearance. She is well-developed. She is not toxic-appearing.  ?HENT:  ?   Head: Normocephalic and atraumatic.  ?Eyes:  ?   General: Lids are normal.  ?   Conjunctiva/sclera: Conjunctivae normal.  ?   Pupils: Pupils are equal, round, and  reactive to light.  ?Neck:  ?   Thyroid: No thyroid mass.  ?   Trachea: No tracheal deviation.  ?Cardiovascular:  ?   Rate and Rhythm: Normal rate and regular rhythm.  ?   Heart sounds: Normal heart sounds. No murmur heard. ?  No gallop.  ?Pulmonary:  ?   Effort: Pulmonary effort is normal. No respiratory distress.  ?   Breath sounds: Normal breath sounds. No stridor. No decreased breath sounds, wheezing, rhonchi or rales.  ?Abdominal:  ?   General: There is no distension.  ?   Palpations: Abdomen is soft.  ?   Tenderness: There is no abdominal tenderness. There is no rebound.  ?Musculoskeletal:     ?   General: No tenderness. Normal range of motion.  ?   Cervical back: Normal range of motion and neck supple.  ?Skin: ?   General: Skin is warm and dry.  ?   Findings: No abrasion or rash.  ?Neurological:  ?   Mental Status: She is alert and oriented to person, place, and time. Mental status is at baseline.  ?   GCS: GCS eye subscore is 4. GCS verbal subscore is 5. GCS motor subscore is 6.  ?   Cranial Nerves: No cranial nerve deficit.  ?   Sensory: No sensory deficit.  ?  Motor: Motor function is intact.  ?Psychiatric:     ?   Attention and Perception: Attention normal.     ?   Speech: Speech normal.     ?   Behavior: Behavior normal.  ? ? ?ED Results / Procedures / Treatments   ?Labs ?(all labs ordered are listed, but only abnormal results are displayed) ?Labs Reviewed  ?GROUP A STREP BY PCR  ?RESP PANEL BY RT-PCR (FLU A&B, COVID) [id number]  ? ? ?EKG ?None ? ?Radiology ?No results found. ? ?Procedures ?Procedures  ? ? ?Medications Ordered in ED ?Medications - No data to display ? ?ED Course/ Medical Decision Making/ A&P ?  ?                        ?Medical Decision Making ? ?Patient strep test here is negative but her COVID test is positive.  She has no signs of respiratory compromise.  She is well-hydrated.  She is stable for discharge ? ? ? ? ? ? ? ?Final Clinical Impression(s) / ED Diagnoses ?Final diagnoses:  ?None   ? ? ?Rx / DC Orders ?ED Discharge Orders   ? ? None  ? ?  ? ? ?  ?Lorre Nick, MD ?02/25/22 1234 ? ?

## 2022-05-06 ENCOUNTER — Encounter: Payer: Self-pay | Admitting: *Deleted

## 2022-10-31 ENCOUNTER — Ambulatory Visit: Payer: Medicaid Other

## 2022-11-11 ENCOUNTER — Other Ambulatory Visit (HOSPITAL_COMMUNITY): Payer: Self-pay

## 2022-11-11 ENCOUNTER — Other Ambulatory Visit: Payer: Self-pay | Admitting: Student

## 2022-11-11 MED ORDER — FLUOXETINE HCL 40 MG PO CAPS
40.0000 mg | ORAL_CAPSULE | Freq: Every day | ORAL | 0 refills | Status: DC
  Filled 2022-11-11: qty 90, 90d supply, fill #0

## 2022-11-12 ENCOUNTER — Other Ambulatory Visit: Payer: Self-pay

## 2022-11-12 ENCOUNTER — Other Ambulatory Visit (HOSPITAL_COMMUNITY): Payer: Self-pay

## 2022-11-13 ENCOUNTER — Other Ambulatory Visit: Payer: Self-pay

## 2022-11-19 ENCOUNTER — Other Ambulatory Visit: Payer: Self-pay

## 2022-11-25 ENCOUNTER — Other Ambulatory Visit (HOSPITAL_COMMUNITY): Payer: Self-pay

## 2022-12-22 ENCOUNTER — Ambulatory Visit (INDEPENDENT_AMBULATORY_CARE_PROVIDER_SITE_OTHER): Payer: Medicaid Other | Admitting: Family Medicine

## 2022-12-22 ENCOUNTER — Other Ambulatory Visit (HOSPITAL_COMMUNITY): Payer: Self-pay

## 2022-12-22 ENCOUNTER — Encounter: Payer: Self-pay | Admitting: Family Medicine

## 2022-12-22 VITALS — BP 130/83 | HR 89 | Ht 63.0 in | Wt 132.8 lb

## 2022-12-22 DIAGNOSIS — F419 Anxiety disorder, unspecified: Secondary | ICD-10-CM

## 2022-12-22 DIAGNOSIS — R002 Palpitations: Secondary | ICD-10-CM

## 2022-12-22 MED ORDER — HYDROXYZINE HCL 10 MG PO TABS
10.0000 mg | ORAL_TABLET | Freq: Three times a day (TID) | ORAL | 0 refills | Status: AC | PRN
  Filled 2022-12-22: qty 60, 20d supply, fill #0

## 2022-12-22 MED ORDER — FLUOXETINE HCL 20 MG PO CAPS
20.0000 mg | ORAL_CAPSULE | Freq: Every day | ORAL | 1 refills | Status: DC
  Filled 2022-12-22: qty 30, 30d supply, fill #0

## 2022-12-22 NOTE — Patient Instructions (Addendum)
It was nice seeing you today!  Take fluoxetine once a day.  Take hydroxyzine 3 times a day as needed for anxiety.  Think about going to therapy. I have attached a list of therapists below.  Stay well, Zola Button, MD [place] [phone]  --  Make sure to check out at the front desk before you leave today.  Please arrive at least 15 minutes prior to your scheduled appointments.  If you had blood work today, I will send you a MyChart message or a letter if results are normal. Otherwise, I will give you a call.  If you had a referral placed, they will call you to set up an appointment. Please give [place] a call if you don't hear back in the next 2 weeks.  If you need additional refills before your next appointment, please call your pharmacy first.   --  Therapy and Counseling Resources Most providers on this list will take Medicaid. Patients with commercial insurance or Medicare should contact their insurance company to get a list of in network providers.  [employer] [employer] (takes children) Location 1: [address], [place], [place] [address] Location 2: [place], [place] [address] [place] ([place] speaking therapist available)(habla espanol)(take medicare and medicaid)  [place], [place], [place] [address], [place] [email or web address] [phone]  [facility]:Psychiatry and Counseling [address] [place], [place] [address] [place], [place], [place] [address]      [phone]  [place] (spanish available) [place], [place] [address] [place] (take [facility] and medicare) [address], [place], [place] [address]       [phone]     [place] (virtual only) [phone]  [facility] Bl[facility] [address], [place], [place]  [facility]:  [place]. [place] [phone]  [facility]:  [place] [address] [phone]  [facility] (under & uninsured) [address], [place] [phone]    [email or web address]    [place] [address]  [place]    [phone]  [facility] [place]     Phone:  [phone]     [place]  [place] [address]      [place], [place]: [place], [place], [place]   [facility] [place] ([place] therapist) [email or web address]  [place] [address]   [place] [address]    [phone]    [facility]   [address] [place],  [place], [place]   [place]  [place]  [facility]  [address] [place], [place]        [phone]  Open Access/Walk In Clinic under & uninsured  [facility]  [address] [place], [place] Crisis [phone]  [facility] [place],  ([place])   [place], [place]: [phone]) 8:30 - 12; 1 - 2:30  [facility] of the [place],  [place], [place]    [phone]):8:30 -  12; 2 - 3PM  [facility] [employer],  [address]  [place]; [phone]):   Mon - Fri 8 AM - 5 PM  [facility] [place]  MWF 12:30 to 3:00 or call to schedule an appointment  [phone]  Specific Provider options Psychology Today  [email or web address] click on find a therapist  enter your zip code left side and select or tailor a therapist for your specific need.   [facility] Provider  Directory [email or web address]  (Medicaid)   Follow all drop down to find a provider  [place] or [email or web address] [address], [place], [place] Recovery support and educational   24- Hour Availability:   [facility]  [address] [place], [place] Crisis [phone]  [facility] the [employer] [phone]  [place]  [phone]   [place]  [phone] (after hours)  Therapeutic Alternative/Mobile Crisis   [phone]  [place] National Suicide Hotline  [phone] Diamantina Monks)  Call 911 or go to emergency room  [facility]  [phone]);  [place] and [employer]  [phone]); [place], [place], [place], [place], [place], [place], [place]

## 2022-12-22 NOTE — Assessment & Plan Note (Signed)
Symptoms are likely secondary to anxiety, she has self discontinued her medications.  She is interested in resuming her medications. - restart fluoxetine 20 mg daily - restart hydroxyzine 10 mg TID prn - therapy resources provided, she will consider establishing with one

## 2022-12-22 NOTE — Progress Notes (Signed)
    SUBJECTIVE:   CHIEF COMPLAINT / HPI:  Chief Complaint  Patient presents with   Palpitations    Patient has been seen for palpitations during previous clinic visits.  She previously has had a TSH 03/2021 which was within normal range.  She has had multiple EKGs, most recent 03/2021 which was in normal sinus rhythm.  Patient reports that she has had intermittent palpitations that occur only at night when laying down that has been ongoing for the past few weeks. It occurs every night, lasting the whole night. Sometimes, it will occur during the day. Her grandmother thinks it might be due to her anxiety. She reports she has been feeling more anxious lately as well. She worries about her health a lot and that she won't succeed in life. She was previously on fluoxetine but stopped taking it 1-2 years ago when she thought she was doing better.  Denies any chest pain, palpitations currently.  PERTINENT  PMH / PSH: anxiety, palpitations  Patient Care Team: Orvis Brill, DO as PCP - General (Family Medicine)   OBJECTIVE:   BP 130/83   Pulse 89   Ht 5\' 3"  (1.6 m)   Wt 132 lb 12.8 oz (60.2 kg)   LMP 12/14/2022   SpO2 100%   BMI 23.52 kg/m   Physical Exam Constitutional:      General: She is not in acute distress. Cardiovascular:     Rate and Rhythm: Normal rate and regular rhythm.     Heart sounds: Normal heart sounds.  Pulmonary:     Effort: Pulmonary effort is normal. No respiratory distress.     Breath sounds: Normal breath sounds.  Musculoskeletal:     Cervical back: Neck supple.  Neurological:     Mental Status: She is alert.         12/22/2022    1:58 PM  Depression screen PHQ 2/9  Decreased Interest 0  Down, Depressed, Hopeless 0  PHQ - 2 Score 0  Altered sleeping 0  Tired, decreased energy 1  Change in appetite 0  Feeling bad or failure about yourself  0  Trouble concentrating 0  Moving slowly or fidgety/restless 0  Suicidal thoughts 0  PHQ-9 Score 1   Difficult doing work/chores Not difficult at all     {Show previous vital signs (optional):23777}    ASSESSMENT/PLAN:   Anxiety Symptoms are likely secondary to anxiety, she has self discontinued her medications.  She is interested in resuming her medications. - restart fluoxetine 20 mg daily - restart hydroxyzine 10 mg TID prn - therapy resources provided, she will consider establishing with one  Palpitations Most likely secondary to anxiety.  She has had normal EKGs in the past and normal TSH. - offered long-term monitoring (Zio patch), patient will defer at this time but could consider if palpitations are not getting better with anxiety treatment    Return in about 7 weeks (around 02/09/2023) for physical, f/u anxiety.   Zola Button, MD [place]

## 2022-12-22 NOTE — Assessment & Plan Note (Signed)
Most likely secondary to anxiety.  She has had normal EKGs in the past and normal TSH. - offered long-term monitoring (Zio patch), patient will defer at this time but could consider if palpitations are not getting better with anxiety treatment

## 2023-02-09 NOTE — Progress Notes (Signed)
    SUBJECTIVE:   Chief compliant/HPI: annual examination  Vickie Bell is a 22 y.o. who presents today for an annual exam.   Anxiety f/u: Was seen ~6 weeks ago for anxiety and palpitations. Taking Fluoxetine daily, feels that this does benefit her but much.  Heart palpitations f/u: Reports palpitations are not improving- they happen throughout the day but worse at night when laying down.  Feels like her heart "stops beating for a second."  Not sexually active. Takes multivitamin. Not using any form of contraception. Looking into going to school; currently unemployed. Lives with Grandma.  Updated history tabs and problem list.   OBJECTIVE:   BP 108/62   Pulse 92   Wt 135 lb 6.4 oz (61.4 kg)   LMP 01/14/2023   SpO2 99%   BMI 23.99 kg/m   General: Pleasant, well-appearing Cardio: Normal S1 and S2, no S3 or S4. Rhythm is regular. No murmurs or rubs.   Pulm: Clear to auscultation bilaterally, no crackles, wheezing, or diminished breath sounds. Normal respiratory effort Abdomen: Bowel sounds normal. Abdomen soft and non-tender.  Extremities: No peripheral edema. Warm/ well perfused.  Strong radial pulses. Neuro: Cranial nerves grossly intact  ASSESSMENT/PLAN:   Anemia, iron deficiency Takes PO ferrous sulfate daily- continue Hgb stable today at 11.4  Palpitations RRR on exam today. Ordered Zio patch per patient request at prior discussion with Dr. Wynelle Link    Annual Examination  See AVS for age appropriate recommendations.   PHQ score 1, reviewed and discussed. GAD-7 elevated. Will increase Prozac to 40 mg daily in lieu of heightened anxiety and per patient request.  Blood pressure reviewed and at goal.   The patient currently does not use anything for contraception. Discussed OTC emergency contraception. Discussed IUD- especially to help with menses in light of anemia. Folate recommended as appropriate, minimum of 400 mcg per day.   Considered the following items  based upon USPSTF recommendations: HIV testing: discussed Hepatitis C: discussed Hepatitis B: discussed Syphilis if at high risk: discussed GC/CT not at high risk and not ordered. Lipid panel (nonfasting or fasting) discussed based upon AHA recommendations and not ordered.  Consider repeat every 4-6 years.  Reviewed risk factors for latent tuberculosis and not indicated  Last pap smear 01/2022; NILM and negative STI screening. Next due March 2026.  Follow up in 1 month for anxiety.  Darral Dash, DO [facility]

## 2023-02-09 NOTE — Patient Instructions (Addendum)
 It was great to see you! Thank you for allowing me to participate in your care!  Our plans for today:  - We increased your dose of Prozac  (Fluoxetine ) to 40 mg daily. Make sure to take this every single day. - Take a prenatal vitamin daily (this has folic acid in it) - I ordered a Zio patch heart monitor for you - Continue taking your iron pill daily  Please schedule a follow up in 1 month so I can see how you are doing. Of course, I am happy to see you sooner if you need anything.  Dr. Aikam Vinje [facility]

## 2023-02-10 ENCOUNTER — Ambulatory Visit (INDEPENDENT_AMBULATORY_CARE_PROVIDER_SITE_OTHER): Payer: Medicaid Other | Admitting: Student

## 2023-02-10 ENCOUNTER — Other Ambulatory Visit (HOSPITAL_COMMUNITY): Payer: Self-pay

## 2023-02-10 VITALS — BP 108/62 | HR 92 | Wt 135.4 lb

## 2023-02-10 DIAGNOSIS — F419 Anxiety disorder, unspecified: Secondary | ICD-10-CM | POA: Diagnosis not present

## 2023-02-10 DIAGNOSIS — R002 Palpitations: Secondary | ICD-10-CM

## 2023-02-10 DIAGNOSIS — D509 Iron deficiency anemia, unspecified: Secondary | ICD-10-CM | POA: Diagnosis present

## 2023-02-10 DIAGNOSIS — Z0000 Encounter for general adult medical examination without abnormal findings: Secondary | ICD-10-CM

## 2023-02-10 LAB — POCT HEMOGLOBIN: Hemoglobin: 11.7 g/dL (ref 11–14.6)

## 2023-02-10 MED ORDER — FLUOXETINE HCL 40 MG PO CAPS
40.0000 mg | ORAL_CAPSULE | Freq: Every day | ORAL | 3 refills | Status: DC
  Filled 2023-02-10: qty 90, 90d supply, fill #0

## 2023-02-10 NOTE — Assessment & Plan Note (Signed)
RRR on exam today. Ordered Zio patch per patient request at prior discussion with Dr. Nancy Fetter

## 2023-02-10 NOTE — Assessment & Plan Note (Signed)
Takes PO ferrous sulfate daily- continue Hgb stable today at 11.4

## 2023-03-19 NOTE — Patient Instructions (Signed)
It was great seeing you today.  Glad to hear that you are having some improvement with your medication change.  I will plan to see back in clinic in 2 to 3 months for follow-up. Please make his appointment at the front desk before you leave   If you have any questions or concerns, please feel free to call the clinic.   Have a wonderful day,  Dr. Darral Dash [facility] [phone]

## 2023-03-19 NOTE — Progress Notes (Signed)
    SUBJECTIVE:   CHIEF COMPLAINT / HPI:   Vickie Bell is a 22 year-old female here for f/u of anxiety and palpitations.  Last seen on 02/10/2023 about 1 month ago.  At that time, increased her Prozac to 40 mg daily. Since then, she reports a lot of improvement. Sleeping better at night. Palpitations are almost gone- much improved. Thinks the increase in dose has reduced her anxiety significantly.  Takes her iron pill daily.  Her PHQ-9 score is 2, somewhat difficult. GAD-7 score of 1.  In regards to her palpitations, I had ordered a Zio patch at that visit. Did not get it completed, but also not having palpitations anymore.  PERTINENT  PMH / PSH: Palpitations, anxiety, iron deficiency anemia  OBJECTIVE:   BP 110/66   Pulse 81   Wt 140 lb 9.6 oz (63.8 kg)   LMP 03/08/2023   SpO2 98%   BMI 24.91 kg/m    General: Alert and cooperative and appears to be in no acute distress Cardio: Normal S1 and S2, no S3 or S4. Rhythm is regular. No murmurs or rubs.   Pulm: Clear to auscultation bilaterally, no crackles, wheezing, or diminished breath sounds. Normal respiratory effort Extremities: No peripheral edema. Warm/ well perfused.  Strong radial pulses. Neuro: Cranial nerves grossly intact Psych: Pleasant. Normal mood and affect.   ASSESSMENT/PLAN:   Anemia, iron deficiency Continue PO ferrous sulfate.  Anxiety Much improved today, subjectively and objectively. PHQ-9 score of 2, no SI/HI. GAD-7 score of 1, down from 6 at last visit. Continue Prozac 40 mg daily. F/u in 3 months.    Darral Dash, DO [facility]

## 2023-03-20 ENCOUNTER — Other Ambulatory Visit (HOSPITAL_COMMUNITY): Payer: Self-pay

## 2023-03-20 ENCOUNTER — Ambulatory Visit (INDEPENDENT_AMBULATORY_CARE_PROVIDER_SITE_OTHER): Payer: Medicaid Other | Admitting: Student

## 2023-03-20 VITALS — BP 110/66 | HR 81 | Wt 140.6 lb

## 2023-03-20 DIAGNOSIS — F419 Anxiety disorder, unspecified: Secondary | ICD-10-CM | POA: Diagnosis not present

## 2023-03-20 DIAGNOSIS — D509 Iron deficiency anemia, unspecified: Secondary | ICD-10-CM | POA: Diagnosis present

## 2023-03-20 MED ORDER — CETIRIZINE HCL 10 MG PO TABS
10.0000 mg | ORAL_TABLET | Freq: Every day | ORAL | 3 refills | Status: DC
  Filled 2023-03-20: qty 90, 90d supply, fill #0

## 2023-03-20 NOTE — Assessment & Plan Note (Addendum)
Much improved today, subjectively and objectively. PHQ-9 score of 2, no SI/HI. GAD-7 score of 1, down from 6 at last visit. Continue Prozac 40 mg daily. F/u in 3 months.

## 2023-03-20 NOTE — Assessment & Plan Note (Signed)
Continue PO ferrous sulfate.

## 2023-06-21 ENCOUNTER — Encounter: Payer: Self-pay | Admitting: Student

## 2024-03-04 ENCOUNTER — Encounter: Admitting: Student

## 2024-03-08 ENCOUNTER — Ambulatory Visit (INDEPENDENT_AMBULATORY_CARE_PROVIDER_SITE_OTHER): Admitting: Student

## 2024-03-08 ENCOUNTER — Other Ambulatory Visit (HOSPITAL_COMMUNITY): Payer: Self-pay

## 2024-03-08 ENCOUNTER — Encounter: Payer: Self-pay | Admitting: Student

## 2024-03-08 VITALS — BP 136/90 | HR 111 | Ht 63.0 in | Wt 151.8 lb

## 2024-03-08 DIAGNOSIS — D509 Iron deficiency anemia, unspecified: Secondary | ICD-10-CM | POA: Diagnosis not present

## 2024-03-08 MED ORDER — FERROUS SULFATE 325 (65 FE) MG PO TBEC
325.0000 mg | DELAYED_RELEASE_TABLET | ORAL | 3 refills | Status: AC
  Filled 2024-03-08: qty 12, 28d supply, fill #0
  Filled 2024-04-19: qty 12, 28d supply, fill #1

## 2024-03-08 MED ORDER — NORGESTIMATE-ETH ESTRADIOL 0.25-35 MG-MCG PO TABS
1.0000 | ORAL_TABLET | Freq: Every day | ORAL | 11 refills | Status: AC
  Filled 2024-03-08: qty 28, 28d supply, fill #0

## 2024-03-08 MED ORDER — CETIRIZINE HCL 10 MG PO TABS
10.0000 mg | ORAL_TABLET | Freq: Every day | ORAL | 3 refills | Status: AC
  Filled 2024-03-08: qty 30, 30d supply, fill #0

## 2024-03-08 MED ORDER — FLUTICASONE PROPIONATE 50 MCG/ACT NA SUSP
2.0000 | Freq: Every day | NASAL | 6 refills | Status: AC
  Filled 2024-03-08: qty 16, 30d supply, fill #0

## 2024-03-08 MED ORDER — AZELASTINE HCL 0.1 % NA SOLN
1.0000 | Freq: Two times a day (BID) | NASAL | 12 refills | Status: AC | PRN
  Filled 2024-03-08: qty 30, 34d supply, fill #0

## 2024-03-08 NOTE — Patient Instructions (Addendum)
 It was great seeing you today.  As we discussed, - I refilled your iron pills - Start taking Sprintec (oral contraceptive) daily - We collected blood work today, I will call if abnormal - Please schedule 2 week follow up for blood pressure check   If you have any questions or concerns, please feel free to call the clinic.   Have a wonderful day,  Dr. Darral Dash [facility] [phone]

## 2024-03-08 NOTE — Assessment & Plan Note (Signed)
 Ferritin, CBC today. - Refilled ferrous sulfate

## 2024-03-08 NOTE — Progress Notes (Signed)
    SUBJECTIVE:   Chief compliant/HPI: annual examination  Vickie Bell is a 23 y.o. who presents today for an annual exam.   Review of systems form notable for .   She is working as a Doctor, general practice. For exercise, she does cardio/weight lifting 5 days a week. No cigarette/vaping use.  She has hx of IDA, and she is still having regular periods. Not heavy. For birth control, not using anything.  Updated history tabs and problem list.   OBJECTIVE:   LMP 02/17/2024   General: NAD, well appearing Cardiac: RRR Neuro: A&O Respiratory: normal WOB on RA. No wheezing or crackles on auscultation, good lung sounds throughout Extremities: Moving all 4 extremities equally  ASSESSMENT/PLAN:   Assessment & Plan Iron deficiency anemia, unspecified iron deficiency anemia type Ferritin, CBC today. - Refilled ferrous sulfate Annual Examination  See AVS for age appropriate recommendations.   PHQ score 0, reviewed and discussed. Blood pressure reviewed and elevated- return in 2 weeks for re-check.  Asked about intimate partner violence and patient reports none, feels safe at home.  The patient currently uses nothing for contraception- prescribed oral combined contraception, no contraindications. Discussed OTC emergency contraception availability as well.  Folate recommended as appropriate, minimum of 400 mcg per day.   Considered the following items based upon USPSTF recommendations: HIV testing: discussed Hepatitis C: discussed Hepatitis B: discussed Syphilis if at high risk: discussed GC/CT  declined   Cervical cancer screening: prior Pap reviewed, repeat due in 2026 Immunizations UTD.   Follow up in 1  year or sooner if indicated.    Darral Dash, DO [facility]

## 2024-04-19 ENCOUNTER — Other Ambulatory Visit (HOSPITAL_COMMUNITY): Payer: Self-pay

## 2024-04-21 ENCOUNTER — Encounter: Payer: Self-pay | Admitting: Student

## 2024-04-21 ENCOUNTER — Other Ambulatory Visit (HOSPITAL_COMMUNITY): Payer: Self-pay

## 2024-04-21 ENCOUNTER — Ambulatory Visit (INDEPENDENT_AMBULATORY_CARE_PROVIDER_SITE_OTHER): Admitting: Student

## 2024-04-21 VITALS — BP 123/96 | HR 96 | Ht 63.0 in | Wt 152.2 lb

## 2024-04-21 DIAGNOSIS — D509 Iron deficiency anemia, unspecified: Secondary | ICD-10-CM | POA: Diagnosis present

## 2024-04-21 DIAGNOSIS — Z3009 Encounter for other general counseling and advice on contraception: Secondary | ICD-10-CM | POA: Diagnosis not present

## 2024-04-21 MED ORDER — FLUOXETINE HCL 20 MG PO CAPS
20.0000 mg | ORAL_CAPSULE | Freq: Every day | ORAL | 3 refills | Status: AC
  Filled 2024-04-21: qty 90, 90d supply, fill #0

## 2024-04-21 NOTE — Progress Notes (Signed)
    SUBJECTIVE:   CHIEF COMPLAINT / HPI:   Discussed the use of AI scribe software for clinical note transcription with the patient, who gave verbal consent to proceed.  History of Present Illness   Vickie Bell is a 23 year old female who presents for follow-up and blood work for anemia.  She takes iron supplements three times a week, with previous concerns about low iron levels. Her levels have improved to a stable range. She is on birth control with normal menstrual cycles, but has started experiencing new cramping. She feels mentally low due to family stress, particularly from living with her grandmother. She has a history of anxiety and depression, previously managed with fluoxetine , but is not currently on medication. Her mother is supportive and has offered to help her access therapy. She maintains contact with her family, though her siblings live in [place].       PERTINENT  PMH / PSH:   OBJECTIVE:   BP (!) 123/96   Pulse 96   Ht 5\' 3"  (1.6 m)   Wt 152 lb 3.2 oz (69 kg)   LMP 04/13/2024 (Exact Date)   SpO2 100%   BMI 26.96 kg/m   General: NAD, well appearing Cardiac: RRR Neuro: A&O Respiratory: normal WOB on RA. No wheezing or crackles on auscultation, good lung sounds throughout Extremities: Moving all 4 extremities equally   ASSESSMENT/PLAN:   Anemia, iron deficiency Ferritin 8, will offer IV iron transfusion Continue PO ferrous sulfate  daily w/ constipation regiment Re check in 4-6 weeks     Vallorie Gayer, DO [facility]

## 2024-04-21 NOTE — Patient Instructions (Addendum)
 It was great seeing you today.  As we discussed, - Labs today - Continue iron tablets as you are - Try therapy, as we discussed   If you have any questions or concerns, please feel free to call the clinic.   Have a wonderful day,  Dr. Vallorie Gayer [facility] [phone]

## 2024-04-22 LAB — CBC WITH DIFFERENTIAL/PLATELET
Basophils Absolute: 0 10*3/uL (ref 0.0–0.2)
Basos: 1 %
EOS (ABSOLUTE): 0.1 10*3/uL (ref 0.0–0.4)
Eos: 2 %
Hematocrit: 32.7 % — ABNORMAL LOW (ref 34.0–46.6)
Hemoglobin: 10.4 g/dL — ABNORMAL LOW (ref 11.1–15.9)
Immature Grans (Abs): 0 10*3/uL (ref 0.0–0.1)
Immature Granulocytes: 0 %
Lymphocytes Absolute: 1.4 10*3/uL (ref 0.7–3.1)
Lymphs: 34 %
MCH: 25.2 pg — ABNORMAL LOW (ref 26.6–33.0)
MCHC: 31.8 g/dL (ref 31.5–35.7)
MCV: 79 fL (ref 79–97)
Monocytes Absolute: 0.2 10*3/uL (ref 0.1–0.9)
Monocytes: 6 %
Neutrophils Absolute: 2.4 10*3/uL (ref 1.4–7.0)
Neutrophils: 57 %
Platelets: 262 10*3/uL (ref 150–450)
RBC: 4.12 x10E6/uL (ref 3.77–5.28)
RDW: 15 % (ref 11.7–15.4)
WBC: 4.2 10*3/uL (ref 3.4–10.8)

## 2024-04-22 LAB — IRON,TIBC AND FERRITIN PANEL
Ferritin: 8 ng/mL — ABNORMAL LOW (ref 15–150)
Iron Saturation: 11 % — ABNORMAL LOW (ref 15–55)
Iron: 48 ug/dL (ref 27–159)
Total Iron Binding Capacity: 421 ug/dL (ref 250–450)
UIBC: 373 ug/dL (ref 131–425)

## 2024-04-22 LAB — BASIC METABOLIC PANEL WITH GFR
BUN/Creatinine Ratio: 26 — ABNORMAL HIGH (ref 9–23)
BUN: 20 mg/dL (ref 6–20)
CO2: 20 mmol/L (ref 20–29)
Calcium: 9.8 mg/dL (ref 8.7–10.2)
Chloride: 103 mmol/L (ref 96–106)
Creatinine, Ser: 0.77 mg/dL (ref 0.57–1.00)
Glucose: 80 mg/dL (ref 70–99)
Potassium: 4.5 mmol/L (ref 3.5–5.2)
Sodium: 140 mmol/L (ref 134–144)
eGFR: 111 mL/min/{1.73_m2} (ref >59)

## 2024-04-22 NOTE — Assessment & Plan Note (Signed)
 Ferritin 8, will offer IV iron transfusion Continue PO ferrous sulfate  daily w/ constipation regiment Re check in 4-6 weeks

## 2024-05-10 ENCOUNTER — Encounter: Payer: Self-pay | Admitting: *Deleted
